# Patient Record
Sex: Male | Born: 1995 | Race: Black or African American | Hispanic: No | Marital: Single | Smoking: Never smoker
Health system: Southern US, Community
[De-identification: ages and names within clinical notes are randomized; demographics above are authoritative.]

## PROBLEM LIST (undated history)

## (undated) DIAGNOSIS — J45909 Unspecified asthma, uncomplicated: Secondary | ICD-10-CM

## (undated) DIAGNOSIS — E119 Type 2 diabetes mellitus without complications: Secondary | ICD-10-CM

## (undated) DIAGNOSIS — T7840XA Allergy, unspecified, initial encounter: Secondary | ICD-10-CM

## (undated) HISTORY — DX: Allergy, unspecified, initial encounter: T78.40XA

---
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
## 2014-01-13 ENCOUNTER — Encounter: Payer: Self-pay | Admitting: Cardiology

## 2015-07-03 ENCOUNTER — Encounter (HOSPITAL_COMMUNITY): Payer: Self-pay | Admitting: *Deleted

## 2015-07-03 ENCOUNTER — Emergency Department (HOSPITAL_COMMUNITY): Payer: No Typology Code available for payment source

## 2015-07-03 ENCOUNTER — Emergency Department (HOSPITAL_COMMUNITY)
Admission: EM | Admit: 2015-07-03 | Discharge: 2015-07-03 | Disposition: A | Discharge status: Home / Self Care | Payer: No Typology Code available for payment source | Attending: Emergency Medicine | Admitting: Emergency Medicine

## 2015-07-03 DIAGNOSIS — Y998 Other external cause status: Secondary | ICD-10-CM | POA: Insufficient documentation

## 2015-07-03 DIAGNOSIS — M25562 Pain in left knee: Secondary | ICD-10-CM

## 2015-07-03 DIAGNOSIS — S8992XA Unspecified injury of left lower leg, initial encounter: Secondary | ICD-10-CM | POA: Insufficient documentation

## 2015-07-03 DIAGNOSIS — Y92310 Basketball court as the place of occurrence of the external cause: Secondary | ICD-10-CM | POA: Insufficient documentation

## 2015-07-03 DIAGNOSIS — Y9367 Activity, basketball: Secondary | ICD-10-CM | POA: Insufficient documentation

## 2015-07-03 DIAGNOSIS — X58XXXA Exposure to other specified factors, initial encounter: Secondary | ICD-10-CM | POA: Insufficient documentation

## 2015-07-03 MED ORDER — IBUPROFEN 400 MG PO TABS
800.0000 mg | ORAL_TABLET | Freq: Once | ORAL | Status: AC
  Administered 2015-07-03: 800 mg via ORAL
  Filled 2015-07-03: qty 2

## 2015-07-03 NOTE — ED Provider Notes (Signed)
CSN: [id number]     Arrival date & time 07/03/15  1240 History  By signing my name below, I, Bethel BornBritney McCollum, attest that this documentation has been prepared under the direction and in the presence of Alveta HeimlichStevi Owain Eckerman, PA-C Electronically Signed: Bethel BornBritney McCollum, ED Scribe. 07/03/2015 2:10 PM   Chief Complaint  Patient presents with  . Knee Pain   Patient is a 20 y.o. male presenting with knee pain. The history is provided by the patient. No language interpreter was used.  Knee Pain Location:  Knee Injury: yes   Knee location:  L knee Pain details:    Quality:  Aching   Radiates to:  Does not radiate   Severity:  Moderate   Onset quality:  Sudden   Timing:  Intermittent   Progression:  Waxing and waning Prior injury to area:  Yes Relieved by:  Rest Worsened by:  Bearing weight and flexion Ineffective treatments:  Ice, elevation and NSAIDs Associated symptoms: no decreased ROM, no muscle weakness, no numbness, no stiffness, no swelling and no tingling    Myrle ShengKaysaun T Turk is a 20 y.o. male who presents to the Emergency Department complaining of constant, 8/10 in severity, aching, anterior left knee pain with onset 6 days ago while playing basketball on a concrete surface. Pt states that he went up for a dunk and came down on the left leg wrong. He denies a twisting injury to the knee; states he came down hard on a straightened leg. He has had a similar injury in the past but this time ibuprofen, ice, and stretching have provided no relief in pain. The pain is worse in the morning upon waking and exacerbated by bending the knee. He states that the knee feels unsteady while walking like it will give out or lock up. The pain is somewhat alleviate with rest. Pt denies swelling at the knee and other injury sustained. Denies weakness or numbness in the left leg. No other complaints today.   History reviewed. No pertinent past medical history. History reviewed. No pertinent past surgical  history. History reviewed. No pertinent family history. Social History  Substance Use Topics  . Smoking status: Never Smoker   . Smokeless tobacco: None  . Alcohol Use: No    Review of Systems  Musculoskeletal: Positive for arthralgias. Negative for stiffness.  All other systems reviewed and are negative.     Allergies  Review of patient's allergies indicates no known allergies.  Home Medications   Prior to Admission medications   Medication Sig Start Date End Date Taking? Authorizing Provider  ibuprofen (ADVIL,MOTRIN) 600 MG tablet Take 1 tablet (600 mg total) by mouth every 6 (six) hours as needed. 11/07/13   Earley FavorGail Schulz, NP   BP 124/81 mmHg  Pulse 68  Temp(Src) 98.6 F (37 C) (Oral)  Resp 18  Ht 6\' 3"  (1.905 m)  Wt 181 lb (82.101 kg)  BMI 22.62 kg/m2  SpO2 100% Physical Exam  Constitutional: He appears well-developed and well-nourished. No distress.  HENT:  Head: Normocephalic and atraumatic.  Right Ear: External ear normal.  Left Ear: External ear normal.  Eyes: Conjunctivae are normal. Right eye exhibits no discharge. Left eye exhibits no discharge. No scleral icterus.  Neck: Normal range of motion.  Cardiovascular: Normal rate and intact distal pulses.   Pedal pulse palpable. Cap refill < 3 seconds  Pulmonary/Chest: Effort normal.  Musculoskeletal: Normal range of motion.       Left knee: He exhibits normal range of motion, no  swelling, no effusion, no deformity, normal alignment, no LCL laxity and no MCL laxity. No tenderness found.  No TTP over anterior left knee where pt indicates pain is. No tenderness at medial and lateral joint lines or in popliteal fossa. No effusion present. FROM intact with pain on flexion. No ligamentous laxity. Pt ambulates favoring the left knee. Moves remaining extremities spontaneously.   Neurological: He is alert. Coordination normal.  5/5 strength of BLE. Sensation to light touch intact throughout.   Skin: Skin is warm and dry.   Psychiatric: He has a normal mood and affect. His behavior is normal.  Nursing note and vitals reviewed.   ED Course  Procedures (including critical care time) DIAGNOSTIC STUDIES: Oxygen Saturation is 100% on RA,  normal by my interpretation.    COORDINATION OF CARE: 2:08 PM Discussed treatment plan which includes left knee XR with pt at bedside and pt agreed to plan.  Labs Review Labs Reviewed - No data to display  Imaging Review Dg Knee Complete 4 Views Left  07/03/2015  CLINICAL DATA:  Initial encounter for Old Injury Lt Knee New Pain From Playing Basketball Yesterday EXAM: LEFT KNEE - COMPLETE 4+ VIEW COMPARISON:  None. FINDINGS: No acute fracture or dislocation.  Joint spaces maintained. IMPRESSION: No acute osseous abnormality. Electronically Signed   By: Jeronimo Greaves M.D.   On: 07/03/2015 14:30   I have personally reviewed and evaluated these images as part of my medical decision-making.   EKG Interpretation None      MDM   Final diagnoses:  Left knee pain    Patient presenting with left knee pain after a hard landing playing basketball. Left lower leg is neurovascularly intact with FROM. No TTP of left knee. No associated effusion. Patient X-Ray negative for obvious fracture or dislocation. Pain managed in ED with ibuprofen. Brace and crutches given and conservative therapy recommended. Discussed RICE therapy and use of OTC pain relievers. Pt advised to follow up with orthopedics if symptoms persist. Return precautions discussed at bedside and given in discharge paperwork. Pt is stable for discharge.  I personally performed the services described in this documentation, which was scribed in my presence. The recorded information has been reviewed and is accurate.    Alveta Heimlich, PA-C 07/03/15 1539  Bethann Berkshire, MD 07/04/15 1414

## 2015-07-03 NOTE — ED Notes (Signed)
Declined W/C at D/C and was escorted to lobby by RN. 

## 2015-07-03 NOTE — ED Notes (Signed)
Pt reports knee pain since last Thursday. Pt reports he has put ice and elevated lt leg.

## 2015-07-03 NOTE — Discharge Instructions (Signed)

## 2016-02-01 ENCOUNTER — Encounter (HOSPITAL_COMMUNITY): Payer: Self-pay

## 2016-02-01 ENCOUNTER — Emergency Department (HOSPITAL_COMMUNITY)
Admission: EM | Admit: 2016-02-01 | Discharge: 2016-02-01 | Disposition: A | Discharge status: Home / Self Care | Payer: Medicaid Other | Attending: Emergency Medicine | Admitting: Emergency Medicine

## 2016-02-01 DIAGNOSIS — R112 Nausea with vomiting, unspecified: Secondary | ICD-10-CM | POA: Insufficient documentation

## 2016-02-01 DIAGNOSIS — R197 Diarrhea, unspecified: Secondary | ICD-10-CM | POA: Diagnosis not present

## 2016-02-01 DIAGNOSIS — J069 Acute upper respiratory infection, unspecified: Secondary | ICD-10-CM | POA: Insufficient documentation

## 2016-02-01 DIAGNOSIS — R109 Unspecified abdominal pain: Secondary | ICD-10-CM | POA: Diagnosis present

## 2016-02-01 MED ORDER — ACETAMINOPHEN 500 MG PO TABS
1000.0000 mg | ORAL_TABLET | Freq: Once | ORAL | Status: AC
  Administered 2016-02-01: 1000 mg via ORAL
  Filled 2016-02-01: qty 2

## 2016-02-01 MED ORDER — ALBUTEROL SULFATE HFA 108 (90 BASE) MCG/ACT IN AERS: 2.0000 | INHALATION_SPRAY | RESPIRATORY_TRACT | 0 refills | Status: DC | PRN

## 2016-02-01 MED ORDER — ONDANSETRON 4 MG PO TBDP: 4.0000 mg | ORAL_TABLET | Freq: Three times a day (TID) | ORAL | 0 refills | Status: DC | PRN

## 2016-02-01 MED ORDER — ONDANSETRON 4 MG PO TBDP
4.0000 mg | ORAL_TABLET | Freq: Once | ORAL | Status: AC
  Administered 2016-02-01: 4 mg via ORAL
  Filled 2016-02-01: qty 1

## 2016-02-01 MED ORDER — DICYCLOMINE HCL 20 MG PO TABS: 20.0000 mg | ORAL_TABLET | Freq: Three times a day (TID) | ORAL | 0 refills | Status: DC

## 2016-02-01 MED ORDER — DICYCLOMINE HCL 10 MG PO CAPS
20.0000 mg | ORAL_CAPSULE | Freq: Once | ORAL | Status: AC
  Administered 2016-02-01: 20 mg via ORAL
  Filled 2016-02-01: qty 2

## 2016-02-01 MED ORDER — LOPERAMIDE HCL 2 MG PO CAPS: 2.0000 mg | ORAL_CAPSULE | Freq: Four times a day (QID) | ORAL | 0 refills | Status: DC | PRN

## 2016-02-01 MED ORDER — LOPERAMIDE HCL 2 MG PO CAPS
4.0000 mg | ORAL_CAPSULE | Freq: Once | ORAL | Status: AC
  Administered 2016-02-01: 4 mg via ORAL
  Filled 2016-02-01: qty 2

## 2016-02-01 NOTE — Discharge Instructions (Signed)
You may alternate between Tylenol 1000 mg every 6 hours as needed for fever and pain and ibuprofen 800 mg every 8 hours as needed for fever and pain. °

## 2016-02-01 NOTE — ED Notes (Signed)
Patient Alert and oriented X4. Stable and ambulatory. Patient verbalized understanding of the discharge instructions.  Patient belongings were taken by the patient.  

## 2016-02-01 NOTE — ED Notes (Signed)
Tolerated fluids without difficulty

## 2016-02-01 NOTE — ED Triage Notes (Signed)
Patient comes by EMS for abdominal pain and generalized abdominal cramping.  Patient was playing basketball and went to use the restroom and threw up twice, now has cramping.  No masses, or distention noted. A&Ox4

## 2016-02-01 NOTE — ED Provider Notes (Addendum)
By signing my name below, I, Avnee Patel, attest that this documentation has been prepared under the direction and in the presence of Layla MawKristen N Stephany Poorman, DO  Electronically Signed: Clovis PuAvnee Patel, ED Scribe. 02/01/16. 11:13 PM.  TIME SEEN: 11:05 PM  CHIEF COMPLAINT:  Chief Complaint  Patient presents with  . Abdominal Pain    HPI:   Todd Holloway is a 20 y.o. male, with a hx of asthma, who presents to the Emergency Department complaining of sudden onset, cramping abdominal pain while playing basketball today. Pt also reports nausea, 2 episodes of vomiting, 4 episodes of diarrhea, a sore throat, dry cough and chills. He states he also experienced an asthma exacerbation while playing basketball today. Pt denies fevers, sick contacts, hx of abdominal surgeries, any other associated symptoms and modifying factors at this time. No drug allergies noted.    ROS: See HPI Constitutional: no fever, +chills Eyes: no drainage  ENT: no runny nose, +sore throat    Cardiovascular:  no chest pain  Resp: no SOB, +cough GI: +abdominal pain, nausea, vomiting and diarrhea GU: no dysuria Integumentary: no rash  Allergy: no hives  Musculoskeletal: no leg swelling  Neurological: no slurred speech ROS otherwise negative  PAST MEDICAL HISTORY/PAST SURGICAL HISTORY:  History reviewed. No pertinent past medical history.  MEDICATIONS:  Prior to Admission medications   Medication Sig Start Date End Date Taking? Authorizing Provider  ibuprofen (ADVIL,MOTRIN) 600 MG tablet Take 1 tablet (600 mg total) by mouth every 6 (six) hours as needed. 11/07/13   Earley FavorGail Schulz, NP    ALLERGIES:  No Known Allergies  SOCIAL HISTORY:  Social History  Substance Use Topics  . Smoking status: Never Smoker  . Smokeless tobacco: Never Used  . Alcohol use No    FAMILY HISTORY: History reviewed. No pertinent family history.  EXAM: BP 144/85 (BP Location: Right Arm)   Pulse 96   Temp 98.4 F (36.9 C) (Oral)   Resp 12    Ht 6\' 2"  (1.88 m)   Wt 195 lb (88.5 kg)   SpO2 100%   BMI 25.04 kg/m  CONSTITUTIONAL: Alert and oriented and responds appropriately to questions. Well-appearing; well-nourished. Afebrile, nontoxic, smiling and lauging with friends in room.  HEAD: Normocephalic EYES: Conjunctivae clear, PERRL, EOMI ENT: normal nose; no rhinorrhea; moist mucous membranes; No pharyngeal erythema or petechiae, no tonsillar hypertrophy or exudate, no uvular deviation, no unilateral swelling, no trismus or drooling, no muffled voice, normal phonation, no stridor, no dental caries present, no drainable dental abscess noted, no Ludwig's angina, tongue sits flat in the bottom of the mouth, no angioedema, no facial erythema or warmth, no facial swelling; no pain with movement of the neck NECK: Supple, no meningismus, no nuchal rigidity, no LAD  CARD: RRR; S1 and S2 appreciated; no murmurs, no clicks, no rubs, no gallops RESP: Normal chest excursion without splinting or tachypnea; breath sounds clear and equal bilaterally; no wheezes, no rhonchi, no rales, no hypoxia or respiratory distress, speaking full sentences ABD/GI: Normal bowel sounds; non-distended; soft, non-tender, no rebound, no guarding, no peritoneal signs, no hepatosplenomegaly  BACK:  The back appears normal and is non-tender to palpation, there is no CVA tenderness EXT: Normal ROM in all joints; non-tender to palpation; no edema; normal capillary refill; no cyanosis, no calf tenderness or swelling    SKIN: Normal color for age and race; warm; no rash NEURO: Moves all extremities equally, sensation to light touch intact diffusely, cranial nerves II through XII intact, normal  speech PSYCH: The patient's mood and manner are appropriate. Grooming and personal hygiene are appropriate.  MEDICAL DECISION MAKING: Patient here with complaints of viral syndrome. He is extremely well-appearing on exam, smiling and laughing with his friends. His lungs are clear to  auscultation with no wheezing. No respiratory distress. No hypoxia. Speaking in full sentences. No signs of pharyngitis, deep space neck infection, peritonsillar abscess on exam. Abdomen soft and nontender. Doubt colitis, diverticulitis, pancreatitis, cholecystitis or appendicitis. Doubt meningitis. Doubt pneumonia. No signs of any life-threatening infection. Suspect viral illness. We'll treat symptomatically with Tylenol, Zofran, Imodium and Bentyl. We'll fluid challenge patient.  I feel he can be discharged home with a work note. We'll discharge with prescriptions for the same and have recommended alternating Tylenol and Motrin for fever and pain. Discussed return precautions.  At this time, I do not feel there is any life-threatening condition present. I have reviewed and discussed all results (EKG, imaging, lab, urine as appropriate) and exam findings with patient/family. I have reviewed nursing notes and appropriate previous records.  I feel the patient is safe to be discharged home without further emergent workup and can continue workup as an outpatient as needed. Discussed usual and customary return precautions. Patient/family verbalize understanding and are comfortable with this plan.  Outpatient follow-up has been provided. All questions have been answered.   I personally performed the services described in this documentation, which was scribed in my presence. The recorded information has been reviewed and is accurate.     Layla MawKristen N Sanya Kobrin, DO 02/01/16 2319    Layla MawKristen N Elby Blackwelder, DO 02/01/16 2329

## 2016-06-07 ENCOUNTER — Emergency Department (HOSPITAL_COMMUNITY): Payer: Medicaid Other

## 2016-06-07 ENCOUNTER — Encounter (HOSPITAL_COMMUNITY): Payer: Self-pay | Admitting: Nurse Practitioner

## 2016-06-07 ENCOUNTER — Emergency Department (HOSPITAL_COMMUNITY)
Admission: EM | Admit: 2016-06-07 | Discharge: 2016-06-07 | Disposition: A | Discharge status: Home / Self Care | Payer: Medicaid Other | Attending: Emergency Medicine | Admitting: Emergency Medicine

## 2016-06-07 DIAGNOSIS — H518 Other specified disorders of binocular movement: Secondary | ICD-10-CM

## 2016-06-07 DIAGNOSIS — Z79899 Other long term (current) drug therapy: Secondary | ICD-10-CM | POA: Insufficient documentation

## 2016-06-07 DIAGNOSIS — R51 Headache: Secondary | ICD-10-CM | POA: Insufficient documentation

## 2016-06-07 DIAGNOSIS — J45909 Unspecified asthma, uncomplicated: Secondary | ICD-10-CM | POA: Insufficient documentation

## 2016-06-07 DIAGNOSIS — R930 Abnormal findings on diagnostic imaging of skull and head, not elsewhere classified: Secondary | ICD-10-CM | POA: Insufficient documentation

## 2016-06-07 DIAGNOSIS — H538 Other visual disturbances: Secondary | ICD-10-CM | POA: Diagnosis not present

## 2016-06-07 DIAGNOSIS — R519 Headache, unspecified: Secondary | ICD-10-CM

## 2016-06-07 HISTORY — DX: Unspecified asthma, uncomplicated: J45.909

## 2016-06-07 LAB — CBC WITH DIFFERENTIAL/PLATELET
BASOS ABS: 0 10*3/uL (ref 0.0–0.1)
BASOS PCT: 0 %
Eosinophils Absolute: 0 10*3/uL (ref 0.0–0.7)
Eosinophils Relative: 0 %
HCT: 43.6 % (ref 39.0–52.0)
Hemoglobin: 15.2 g/dL (ref 13.0–17.0)
Lymphocytes Relative: 14 %
Lymphs Abs: 1.3 10*3/uL (ref 0.7–4.0)
MCH: 31.1 pg (ref 26.0–34.0)
MCHC: 34.9 g/dL (ref 30.0–36.0)
MCV: 89.2 fL (ref 78.0–100.0)
MONO ABS: 0.9 10*3/uL (ref 0.1–1.0)
Monocytes Relative: 10 %
Neutro Abs: 7 10*3/uL (ref 1.7–7.7)
Neutrophils Relative %: 76 %
Platelets: 182 10*3/uL (ref 150–400)
RBC: 4.89 MIL/uL (ref 4.22–5.81)
RDW: 12.2 % (ref 11.5–15.5)
WBC: 9.2 10*3/uL (ref 4.0–10.5)

## 2016-06-07 LAB — RAPID URINE DRUG SCREEN, HOSP PERFORMED
Amphetamines: NOT DETECTED
BENZODIAZEPINES: NOT DETECTED
Barbiturates: NOT DETECTED
COCAINE: NOT DETECTED
Opiates: NOT DETECTED
Tetrahydrocannabinol: NOT DETECTED

## 2016-06-07 LAB — BASIC METABOLIC PANEL
Anion gap: 11 (ref 5–15)
BUN: 5 mg/dL — AB (ref 6–20)
CO2: 28 mmol/L (ref 22–32)
Calcium: 9.7 mg/dL (ref 8.9–10.3)
Chloride: 100 mmol/L — ABNORMAL LOW (ref 101–111)
Creatinine, Ser: 1.15 mg/dL (ref 0.61–1.24)
GFR calc Af Amer: >60 mL/min (ref >60)
GFR calc non Af Amer: >60 mL/min (ref >60)
GLUCOSE: 102 mg/dL — AB (ref 65–99)
POTASSIUM: 3.2 mmol/L — AB (ref 3.5–5.1)
Sodium: 139 mmol/L (ref 135–145)

## 2016-06-07 MED ORDER — IOPAMIDOL (ISOVUE-370) INJECTION 76%
INTRAVENOUS | Status: AC
  Filled 2016-06-07: qty 50

## 2016-06-07 MED ORDER — IBUPROFEN 200 MG PO TABS
600.0000 mg | ORAL_TABLET | Freq: Once | ORAL | Status: AC
  Administered 2016-06-07: 16:00:00 600 mg via ORAL
  Filled 2016-06-07: qty 1

## 2016-06-07 MED ORDER — GADOBENATE DIMEGLUMINE 529 MG/ML IV SOLN
19.0000 mL | Freq: Once | INTRAVENOUS | Status: AC | PRN
  Administered 2016-06-07: 19 mL via INTRAVENOUS

## 2016-06-07 MED ORDER — IBUPROFEN 600 MG PO TABS: 600.0000 mg | ORAL_TABLET | Freq: Four times a day (QID) | ORAL | 0 refills | Status: DC | PRN

## 2016-06-07 NOTE — ED Notes (Signed)
Patient transported to CT 

## 2016-06-07 NOTE — Consult Note (Signed)
NEURO HOSPITALIST CONSULT NOTE   Requestig physician: Dr. 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 Patient states it started yesterday afternoon and continued throughout the night. His generalized heaviness of his limbs continued even while he was sleeping. Patient also complains of back pain. Patient one to class today and noticed he continued to have heaviness in his legs and felt as though his gait was off to the point where he had lean against a wall at one point. He also noted that there was some intermittent blurred vision but he could not be very descriptive with this. Patient came to the emergency room to be evaluated.  Past Medical History:  Diagnosis Date  . Asthma     History reviewed. No pertinent surgical history.  Family History: Mother Hypertension Father upper tension  Social History:  reports that he has never smoked. He has never used smokeless tobacco. He reports that he does not drink alcohol or use drugs.  No Known Allergies  MEDICATIONS:                                                                                                                     Current Facility-Administered Medications  Medication Dose Route Frequency Provider Last Rate Last Dose  . iopamidol (ISOVUE-370) 76 % injection            Current Outpatient Prescriptions  Medication Sig Dispense Refill  . albuterol (PROVENTIL HFA;VENTOLIN HFA) 108 (90 Base) MCG/ACT inhaler Inhale 2 puffs into the lungs every 4 (four) hours as needed for wheezing or shortness of  breath. 1 Inhaler 0  . dicyclomine (BENTYL) 20 MG tablet Take 1 tablet (20 mg total) by mouth 3 (three) times daily before meals. As needed for abdominal cramping (Patient not taking: Reported  on 06/07/2016) 15 tablet 0  . ibuprofen (ADVIL,MOTRIN) 600 MG tablet Take 1 tablet (600 mg total) by mouth every 6 (six) hours as needed. (Patient not taking: Reported on 06/07/2016) 30 tablet 0  . loperamide (IMODIUM) 2 MG capsule Take 1 capsule (2 mg total) by mouth 4 (four) times daily as needed for diarrhea or loose stools. (Patient not taking: Reported on 06/07/2016) 12 capsule 0  . ondansetron (ZOFRAN ODT) 4 MG disintegrating tablet Take 1-2 tablets (4-8 mg total) by mouth every 8 (eight) hours as needed for nausea or vomiting. (Patient not taking: Reported on 06/07/2016) 20 tablet 0      ROS:                                                                                                                                       History obtained from the patient  General ROS: Positive for - chills, fatigue,  Psychological ROS: negative for - behavioral disorder, hallucinations, memory difficulties, mood swings or suicidal ideation Ophthalmic ROS: Positive for - blurry vision,  ENT ROS: negative for - epistaxis, nasal discharge, oral lesions, sore throat, tinnitus or vertigo Allergy and Immunology ROS: negative for - hives or itchy/watery eyes Hematological and Lymphatic ROS: negative for - bleeding problems, bruising or swollen lymph nodes Endocrine ROS: negative for - galactorrhea, hair pattern changes, polydipsia/polyuria or temperature intolerance Respiratory ROS: negative for - cough, hemoptysis, shortness of breath or wheezing Cardiovascular ROS: negative for - chest pain, dyspnea on exertion, edema or irregular heartbeat Gastrointestinal ROS: negative for - abdominal pain, diarrhea, hematemesis, nausea/vomiting or stool incontinence Genito-Urinary ROS: negative for - dysuria, hematuria, incontinence  or urinary frequency/urgency Musculoskeletal ROS: negative for - joint swelling or muscular weakness Neurological ROS: as noted in HPI Dermatological ROS: negative for rash and skin lesion changes   Blood pressure (!) 115/59, pulse 72, temperature 99 F (37.2 C), temperature source Oral, resp. rate 15, SpO2 97 %.   Neurologic Examination:                                                                                                      HEENT-  Normocephalic, no lesions, without obvious abnormality.  Normal external eye and conjunctiva.  Normal TM's bilaterally.  Normal auditory canals and external ears. Normal external nose, mucus membranes and septum.  Normal pharynx. Cardiovascular- S1, S2 normal, pulses palpable throughout   Lungs- chest clear, no wheezing, rales, normal symmetric air entry, Heart exam - S1, S2 normal, no  murmur, no gallop, rate regular Abdomen- soft, non-tender; bowel sounds normal; no masses,  no organomegaly Extremities- no edema Lymph-no adenopathy palpable Musculoskeletal-no joint tenderness, deformity or swelling Skin-warm and dry, no hyperpigmentation, vitiligo, or suspicious lesions  Neurological Examination Mental Status: Alert, oriented, thought content appropriate.  Speech fluent without evidence of aphasia.  Able to follow 3 step commands without difficulty. Cranial Nerves: II: Intact bilaterally Visual fields grossly normal III,IV, VI: ptosis not present, extra-ocular motions intact bilaterally,  pupils equal, round, reactive to light and accommodation -When attempting to do extraocular movements patient would erratically move his eyes however they did move conjugately and there were no disconjugate movements. When patient was not paying attention and telling the history there were no erratic movements and he was looking throughout the room with smooth gaze and pursuit  V,VII: smile symmetric, facial light touch sensation normal bilaterally VIII:  hearing normal bilaterally IX,X: uvula rises symmetrically XI: bilateral shoulder shrug XII: midline tongue extension Motor: Right : Upper extremity   5/5    Left:     Upper extremity   5/5  Lower extremity   5/5     Lower extremity   5/5 --Mild give way weakness throughout--especially with hand grip, and left dorsiflexion of ankle Tone and bulk:normal tone throughout; no atrophy noted Sensory: Pinprick and light touch intact throughout, bilaterally Deep Tendon Reflexes: 2+ and symmetric throughout Plantars: Right: downgoing   Left: downgoing Cerebellar: normal finger-to-nose, and normal heel-to-shin test Gait: Not tested      Lab Results: Basic Metabolic Panel:  Recent Labs Lab 06/07/16 1017  NA 139  K 3.2*  CL 100*  CO2 28  GLUCOSE 102*  BUN 5*  CREATININE 1.15  CALCIUM 9.7    Liver Function Tests: No results for input(s): AST, ALT, ALKPHOS, BILITOT, PROT, ALBUMIN in the last 168 hours. No results for input(s): LIPASE, AMYLASE in the last 168 hours. No results for input(s): AMMONIA in the last 168 hours.  CBC:  Recent Labs Lab 06/07/16 1017  WBC 9.2  NEUTROABS 7.0  HGB 15.2  HCT 43.6  MCV 89.2  PLT 182    Cardiac Enzymes: No results for input(s): CKTOTAL, CKMB, CKMBINDEX, TROPONINI in the last 168 hours.  Lipid Panel: No results for input(s): CHOL, TRIG, HDL, CHOLHDL, VLDL, LDLCALC in the last 168 hours.  CBG: No results for input(s): GLUCAP in the last 168 hours.  Microbiology: Results for orders placed or performed during the hospital encounter of 07/27/13  Urine culture     Status: None   Collection Time: 07/27/13  8:03 PM  Result Value Ref Range Status   Specimen Description URINE, CLEAN CATCH  Final   Special Requests Normal  Final   Culture  Setup Time   Final    07/27/2013 21:11 Performed at [facility]   Colony Count NO GROWTH Performed at [facility]  Final   Culture NO GROWTH Performed at [facility]   Final   Report Status 07/29/2013 FINAL  Final    Coagulation Studies: No results for input(s): LABPROT, INR in the last 72 hours.  Imaging: Ct Head Wo Contrast  Result Date: 06/07/2016 CLINICAL DATA:  Dizziness, headache EXAM: CT HEAD WITHOUT CONTRAST TECHNIQUE: Contiguous axial images were obtained from the base of the skull through the vertex without intravenous contrast. COMPARISON:  None. FINDINGS: Brain: No evidence of acute infarction, hemorrhage, hydrocephalus, extra-axial collection or mass lesion/mass effect. Vascular: No hyperdense vessel or unexpected calcification. Skull: No osseous  abnormality. Sinuses/Orbits: Visualized paranasal sinuses are clear. Visualized mastoid sinuses are clear. Visualized orbits demonstrate no focal abnormality. Other: None IMPRESSION: No acute intracranial pathology. Electronically Signed   By: Elige Ko   On: 06/07/2016 11:54       Assessment and plan per attending neurologist  Felicie Morn PA-C [facility] [phone]  06/07/2016, 1:27 PM   I have seen the patient and agree with the above findings. His extraocular movements are not clearly physiologic, but he does complain of photophobia, nausea vomiting, headache.  Assessment/Plan: 21 year old male with possible comp complicated migraine with some embellishment. I do think that an MRI is warranted given his complaints, however if this is negative though it favor treating as complicated migraine.  1) MRI, MRA head 2) if negative, would treat as complicated migraine   Ritta Slot, MD [facility] [phone]  If 7pm- 7am, please page neurology on call as listed in [facility].

## 2016-06-07 NOTE — ED Provider Notes (Addendum)
[place] DEPT Provider Note   CSN: [id number] Arrival date & time: 06/07/16  3500     History   Chief Complaint Chief Complaint  Patient presents with  . Headache    HPI Todd Holloway is a 21 y.o. male.  HPI Pt comes in with cc of headaches. Pt has no medical hx. He reports that yday he started having chills and L sided elbow pain, followed by blurry vision bilaterally and headaches. The headaches are in the front, constant, sharp and moderately severe. Pt also reports sweats and weakness in his legs and nausea. Pt denies any new numbness, tingling, one sided weakness. Pt denies trauma, drug use, alcohol abuse. He denies recent travels, rashes, tick bites. Pt also denies any neck pain or stiffness. PT felt hot, but there is no documented fevers.   Past Medical History:  Diagnosis Date  . Asthma     There are no active problems to display for this patient.   History reviewed. No pertinent surgical history.     Home Medications    Prior to Admission medications   Medication Sig Start Date End Date Taking? Authorizing Provider  albuterol (PROVENTIL HFA;VENTOLIN HFA) 108 (90 Base) MCG/ACT inhaler Inhale 2 puffs into the lungs every 4 (four) hours as needed for wheezing or shortness of breath. 02/01/16   Kristen N Ward, DO  dicyclomine (BENTYL) 20 MG tablet Take 1 tablet (20 mg total) by mouth 3 (three) times daily before meals. As needed for abdominal cramping Patient not taking: Reported on 06/07/2016 02/01/16   Kristen N Ward, DO  ibuprofen (ADVIL,MOTRIN) 600 MG tablet Take 1 tablet (600 mg total) by mouth every 6 (six) hours as needed. Patient not taking: Reported on 06/07/2016 11/07/13   Junius Creamer, NP  loperamide (IMODIUM) 2 MG capsule Take 1 capsule (2 mg total) by mouth 4 (four) times daily as needed for diarrhea or loose stools. Patient not taking: Reported on 06/07/2016 02/01/16   Kristen N Ward, DO  ondansetron (ZOFRAN ODT) 4 MG disintegrating tablet Take  1-2 tablets (4-8 mg total) by mouth every 8 (eight) hours as needed for nausea or vomiting. Patient not taking: Reported on 06/07/2016 02/01/16   [place], DO    Family History History reviewed. No pertinent family history.  Social History Social History  Substance Use Topics  . Smoking status: Never Smoker  . Smokeless tobacco: Never Used  . Alcohol use No     Allergies   Patient has no known allergies.   Review of Systems Review of Systems  All other systems reviewed and are negative.    Physical Exam Updated Vital Signs BP (!) 115/59   Pulse 72   Temp 99 F (37.2 C) (Oral)   Resp 15   SpO2 97%   Physical Exam  Constitutional: He is oriented to person, place, and time. He appears well-developed and well-nourished.  HENT:  Head: Normocephalic and atraumatic.  Eyes: Pupils are equal, round, and reactive to light.  Pt is noted to have saccadic eye movement with his gaze, in both direction for both of the eyes.     Visual Acuity  Right Eye Distance:   Left Eye Distance:   Bilateral Distance:    Right Eye Near: R Near: 20/80 Left Eye Near:  L Near: 20/63 Bilateral Near:  20/125   Neck: Normal range of motion. Neck supple. No JVD present.  Cardiovascular: Normal rate and regular rhythm.   Pulmonary/Chest: Effort normal and breath sounds normal.  No respiratory distress. He has no wheezes.  Abdominal: Soft. Bowel sounds are normal. He exhibits no distension. There is no tenderness. There is no rebound and no guarding.  Neurological: He is alert and oriented to person, place, and time. No cranial nerve deficit. Coordination normal.  NIHSS - 0 No objective sensory deficits, Motor strength upper and lower extremity 4+ and equal Normal cerebellar exam Pt ambulated and the gait was steady  Skin: Skin is warm and dry.  Nursing note and vitals reviewed.    ED Treatments / Results  Labs (all labs ordered are listed, but only abnormal results are  displayed) Labs Reviewed  BASIC METABOLIC PANEL - Abnormal; Notable for the following:       Result Value   Potassium 3.2 (*)    Chloride 100 (*)    Glucose, Bld 102 (*)    BUN 5 (*)    All other components within normal limits  CBC WITH DIFFERENTIAL/PLATELET  RAPID URINE DRUG SCREEN, HOSP PERFORMED    EKG  EKG Interpretation  Date/Time:  Friday June 07 2016 09:38:30 EDT Ventricular Rate:  79 PR Interval:    QRS Duration: 83 QT Interval:  367 QTC Calculation: 421 R Axis:   36 Text Interpretation:  Sinus rhythm No acute changes No old tracing to compare Confirmed by Kathrynn Humble, MD, Thelma Comp [phone]) on 06/07/2016 11:02:13 AM       Radiology Ct Head Wo Contrast  Result Date: 06/07/2016 CLINICAL DATA:  Dizziness, headache EXAM: CT HEAD WITHOUT CONTRAST TECHNIQUE: Contiguous axial images were obtained from the base of the skull through the vertex without intravenous contrast. COMPARISON:  None. FINDINGS: Brain: No evidence of acute infarction, hemorrhage, hydrocephalus, extra-axial collection or mass lesion/mass effect. Vascular: No hyperdense vessel or unexpected calcification. Skull: No osseous abnormality. Sinuses/Orbits: Visualized paranasal sinuses are clear. Visualized mastoid sinuses are clear. Visualized orbits demonstrate no focal abnormality. Other: None IMPRESSION: No acute intracranial pathology. Electronically Signed   By: Kathreen Devoid   On: 06/07/2016 11:54    Procedures Procedures (including critical care time)  Medications Ordered in ED Medications  iopamidol (ISOVUE-370) 76 % injection (not administered)     Initial Impression / Assessment and Plan / ED Course  I have reviewed the triage vital signs and the nursing notes.  Pertinent labs & imaging results that were available during my care of the patient were reviewed by me and considered in my medical decision making (see chart for details).  Clinical Course as of Jun 08 1326  Fri Jun 07, 2016  1319 Labs  reassuring. Neuro recommended CT angio head and neck and they will see the patient.  [AN]    Clinical Course User Index [AN] Varney Biles, MD    Pt comes in with cc of headache and myriad of non specific complains.  He has bilateral blurry vision and also noted to have saccadic eye movement bilaterally for both of the eyes.  UDS is neg. Hx not indicative of any red flags for infection, drug use, trauma. CT head is neg. There hs no meningismus.  Neuro consulted. Concerns would be for viral encephalopathy or midbrain disease leading to the gaze issues. demyelating disease also possible.  1:29 PM Neuro also recommends MRI brain. Their suspicion is low for organic dz.   Final Clinical Impressions(s) / ED Diagnoses   Final diagnoses:  Acute nonintractable headache, unspecified headache type  Blurry vision, bilateral    New Prescriptions New Prescriptions   No medications on file  Varney Biles, MD 06/07/16 [place], MD 06/07/16 1330

## 2016-06-07 NOTE — ED Triage Notes (Signed)
Pt presents with c/o headache. The headache began last night. He reports sweats, blurred vision, weakness and pain in his legs, nausea. He tried nyquil with no relief. He denies a past history of headaches.

## 2016-06-07 NOTE — Discharge Instructions (Signed)
We saw you in the ER for headaches and other associated symptoms. We are not sure what is causing your headaches, however, there appears to be no evidence of infection, bleeds or tumors based on our exam and results. Neurology has cleared you. MRI of the brain are normal.  Please take motrin round the clock for the next 6 hours, and take other meds prescribed only for break through pain. See your doctor if the pain persists, as you might need better medications or a specialist.  Please return to the ER if the headache gets severe and in not improving, you have associated new one sided numbness, tingling, weakness or confusion, seizures, poor balance or poor vision.  See the eye specialist if the vision continues to be blurry.

## 2016-06-11 ENCOUNTER — Ambulatory Visit (HOSPITAL_COMMUNITY)
Admission: EM | Admit: 2016-06-11 | Discharge: 2016-06-11 | Disposition: A | Discharge status: Home / Self Care | Payer: Medicaid Other | Attending: Family Medicine | Admitting: Family Medicine

## 2016-06-11 ENCOUNTER — Encounter (HOSPITAL_COMMUNITY): Payer: Self-pay

## 2016-06-11 ENCOUNTER — Encounter (HOSPITAL_COMMUNITY): Payer: Self-pay | Admitting: Emergency Medicine

## 2016-06-11 ENCOUNTER — Emergency Department (HOSPITAL_COMMUNITY)
Admission: EM | Admit: 2016-06-11 | Discharge: 2016-06-12 | Disposition: A | Discharge status: Home / Self Care | Payer: Medicaid Other | Attending: Emergency Medicine | Admitting: Emergency Medicine

## 2016-06-11 DIAGNOSIS — J45909 Unspecified asthma, uncomplicated: Secondary | ICD-10-CM | POA: Insufficient documentation

## 2016-06-11 DIAGNOSIS — R300 Dysuria: Secondary | ICD-10-CM

## 2016-06-11 DIAGNOSIS — Z79899 Other long term (current) drug therapy: Secondary | ICD-10-CM | POA: Diagnosis not present

## 2016-06-11 DIAGNOSIS — G4489 Other headache syndrome: Secondary | ICD-10-CM | POA: Diagnosis not present

## 2016-06-11 DIAGNOSIS — R112 Nausea with vomiting, unspecified: Secondary | ICD-10-CM | POA: Diagnosis present

## 2016-06-11 DIAGNOSIS — A609 Anogenital herpesviral infection, unspecified: Secondary | ICD-10-CM | POA: Diagnosis not present

## 2016-06-11 DIAGNOSIS — H519 Unspecified disorder of binocular movement: Secondary | ICD-10-CM

## 2016-06-11 DIAGNOSIS — Z202 Contact with and (suspected) exposure to infections with a predominantly sexual mode of transmission: Secondary | ICD-10-CM | POA: Diagnosis not present

## 2016-06-11 LAB — PROTEIN, CSF: TOTAL PROTEIN, CSF: 24 mg/dL (ref 15–45)

## 2016-06-11 LAB — CSF CELL COUNT WITH DIFFERENTIAL
RBC COUNT CSF: 1 /mm3 — AB
RBC Count, CSF: 6 /mm3 — ABNORMAL HIGH
Tube #: 1
Tube #: 4
WBC CSF: 1 /mm3 (ref 0–5)
WBC, CSF: 2 /mm3 (ref 0–5)

## 2016-06-11 LAB — URINALYSIS, ROUTINE W REFLEX MICROSCOPIC
Glucose, UA: NEGATIVE mg/dL
HGB URINE DIPSTICK: NEGATIVE
KETONES UR: 80 mg/dL — AB
LEUKOCYTES UA: NEGATIVE
Nitrite: NEGATIVE
PH: 8 (ref 5.0–8.0)
PROTEIN: 30 mg/dL — AB
Specific Gravity, Urine: 1.027 (ref 1.005–1.030)
Squamous Epithelial / LPF: NONE SEEN

## 2016-06-11 LAB — COMPREHENSIVE METABOLIC PANEL
ALT: 27 U/L (ref 17–63)
AST: 50 U/L — AB (ref 15–41)
Albumin: 4 g/dL (ref 3.5–5.0)
Alkaline Phosphatase: 65 U/L (ref 38–126)
Anion gap: 8 (ref 5–15)
BUN: 11 mg/dL (ref 6–20)
CALCIUM: 9.1 mg/dL (ref 8.9–10.3)
CO2: 28 mmol/L (ref 22–32)
Chloride: 100 mmol/L — ABNORMAL LOW (ref 101–111)
Creatinine, Ser: 1.12 mg/dL (ref 0.61–1.24)
GFR calc Af Amer: >60 mL/min (ref >60)
GFR calc non Af Amer: >60 mL/min (ref >60)
Glucose, Bld: 112 mg/dL — ABNORMAL HIGH (ref 65–99)
POTASSIUM: 3.3 mmol/L — AB (ref 3.5–5.1)
SODIUM: 136 mmol/L (ref 135–145)
TOTAL PROTEIN: 7.4 g/dL (ref 6.5–8.1)
Total Bilirubin: 0.7 mg/dL (ref 0.3–1.2)

## 2016-06-11 LAB — RAPID HIV SCREEN (HIV 1/2 AB+AG)
HIV 1/2 ANTIBODIES: NONREACTIVE
HIV-1 P24 Antigen - HIV24: NONREACTIVE

## 2016-06-11 LAB — GLUCOSE, CSF: GLUCOSE CSF: 64 mg/dL (ref 40–70)

## 2016-06-11 LAB — CBC
HEMATOCRIT: 41 % (ref 39.0–52.0)
Hemoglobin: 14.5 g/dL (ref 13.0–17.0)
MCH: 30.8 pg (ref 26.0–34.0)
MCHC: 35.4 g/dL (ref 30.0–36.0)
MCV: 87 fL (ref 78.0–100.0)
Platelets: 189 10*3/uL (ref 150–400)
RBC: 4.71 MIL/uL (ref 4.22–5.81)
RDW: 11.5 % (ref 11.5–15.5)
WBC: 8 10*3/uL (ref 4.0–10.5)

## 2016-06-11 LAB — LIPASE, BLOOD: LIPASE: 28 U/L (ref 11–51)

## 2016-06-11 LAB — RAPID STREP SCREEN (MED CTR MEBANE ONLY): Streptococcus, Group A Screen (Direct): NEGATIVE

## 2016-06-11 MED ORDER — AZITHROMYCIN 250 MG PO TABS
ORAL_TABLET | ORAL | Status: AC
  Filled 2016-06-11: qty 4

## 2016-06-11 MED ORDER — ACYCLOVIR 800 MG PO TABS: 800.0000 mg | ORAL_TABLET | Freq: Every day | ORAL | 0 refills | Status: DC

## 2016-06-11 MED ORDER — KETOROLAC TROMETHAMINE 30 MG/ML IJ SOLN
30.0000 mg | Freq: Once | INTRAMUSCULAR | Status: AC
  Administered 2016-06-11: 30 mg via INTRAVENOUS
  Filled 2016-06-11: qty 1

## 2016-06-11 MED ORDER — DEXTROSE 5 % IV SOLN
10.0000 mg/kg | Freq: Once | INTRAVENOUS | Status: AC
  Administered 2016-06-11: 850 mg via INTRAVENOUS
  Filled 2016-06-11: qty 17

## 2016-06-11 MED ORDER — SODIUM CHLORIDE 0.9 % IV BOLUS (SEPSIS)
1000.0000 mL | Freq: Once | INTRAVENOUS | Status: AC
  Administered 2016-06-11: 1000 mL via INTRAVENOUS

## 2016-06-11 MED ORDER — LIDOCAINE HCL (PF) 1 % IJ SOLN
5.0000 mL | Freq: Once | INTRAMUSCULAR | Status: AC
  Administered 2016-06-11: 5 mL
  Filled 2016-06-11: qty 5

## 2016-06-11 MED ORDER — CEFTRIAXONE SODIUM 250 MG IJ SOLR
250.0000 mg | Freq: Once | INTRAMUSCULAR | Status: AC
  Administered 2016-06-11: 250 mg via INTRAMUSCULAR

## 2016-06-11 MED ORDER — ONDANSETRON HCL 4 MG/2ML IJ SOLN
4.0000 mg | Freq: Once | INTRAMUSCULAR | Status: AC
  Administered 2016-06-11: 4 mg via INTRAVENOUS
  Filled 2016-06-11: qty 2

## 2016-06-11 MED ORDER — ONDANSETRON 4 MG PO TBDP
4.0000 mg | ORAL_TABLET | Freq: Three times a day (TID) | ORAL | 0 refills | Status: DC | PRN
Start: 2016-06-11 — End: 2017-09-01

## 2016-06-11 MED ORDER — AZITHROMYCIN 250 MG PO TABS
1000.0000 mg | ORAL_TABLET | Freq: Once | ORAL | Status: AC
  Administered 2016-06-11: 1000 mg via ORAL

## 2016-06-11 MED ORDER — CEFTRIAXONE SODIUM 250 MG IJ SOLR
INTRAMUSCULAR | Status: AC
  Filled 2016-06-11: qty 250

## 2016-06-11 MED ORDER — LIDOCAINE HCL (PF) 1 % IJ SOLN
INTRAMUSCULAR | Status: AC
  Filled 2016-06-11: qty 2

## 2016-06-11 NOTE — Discharge Instructions (Signed)
You're being treated today with Rocephin, and azithromycin, to cover for your exposure to chlamydia. The urine samples you gave today will be tested for gonorrhea, chlamydia, Trichomonas, and blood work is been obtained to test for HIV, and syphilis. Your skin rash, is consistent with infection with HSV, and I have started you on acyclovir, take one tablet 5 times a day for 7 days. I recommend safe sex practices or abstinence for the next 7 days, I recommend you follow-up with the health department or return to clinic for rescreening to ensure clearance. If you are positive for anything on these tests, you will be notified in 3-5 business days.

## 2016-06-11 NOTE — ED Provider Notes (Signed)
Emergency Department Provider Note   I have reviewed the triage vital signs and the nursing notes.   HISTORY  Chief Complaint Emesis and Abdominal Pain   HPI Todd Holloway is a 21 y.o. male with PMH of asthma presents to the ED for evaluation of nausea, vomiting, and abdominal pain with continued intermittent HA. The patient was seen in the emergency department several days ago with headache. He was found to have some gaze abnormality and ultimately had an MRI of his brain and neurology consultation. This workup was unremarkable and his symptoms are thought to be from a complicated migraine. Patient states he was given medications and has taken these over the past several days with intermittent relief. Patient woke up this morning without a headache and was feeling well. He states he had a new lesion on the penis and went to urgent care where he was reportedly diagnosed with HSV. Patient states that on the ride home from that visit he had acute onset nausea and vomiting with generalized abdominal discomfort. He notes return of his headache upon return home. He reports fever over the past several days with the highest recorded value of 99 F. he is also complaining of some mild sore throat. Patient was given acyclovir at urgent care for likely herpes. Patient denies any auditory or visual hallucinations. He does not take other medications. Reports generalized weakness but no focal weakness/numbness.   Past Medical History:  Diagnosis Date  . Asthma     Patient Active Problem List   Diagnosis Date Noted  . HSV (herpes simplex virus) anogenital infection 06/11/2016    History reviewed. No pertinent surgical history.  Current Outpatient Rx  . Order #: [id number] Class: Print  . Order #: [id number] Class: Normal  . Order #: [id number] Class: Print    Allergies Patient has no known allergies.  No family history on file.  Social History Social History  Substance Use Topics  .  Smoking status: Never Smoker  . Smokeless tobacco: Never Used  . Alcohol use No    Review of Systems  Constitutional: No fever/chills Eyes: No visual changes. ENT: Positive sore throat. Cardiovascular: Denies chest pain. Respiratory: Denies shortness of breath. Gastrointestinal: Positive generalized abdominal pain. Positive nausea and vomiting.  No diarrhea.  No constipation. Genitourinary: Negative for dysuria. Musculoskeletal: Negative for back pain. Skin: Negative for rash. Neurological: Negative for focal weakness or numbness. Positive HA.   10-point ROS otherwise negative.  ____________________________________________   PHYSICAL EXAM:  VITAL SIGNS: ED Triage Vitals  Enc Vitals Group     BP 06/11/16 1745 115/80     Pulse Rate 06/11/16 1745 87     Resp 06/11/16 1745 18     Temp 06/11/16 1745 99 F (37.2 C)     Temp Source 06/11/16 1745 Oral     SpO2 06/11/16 1745 100 %     Weight 06/11/16 1741 187 lb (84.8 kg)     Height 06/11/16 1741  (1.88 m)     Pain Score 06/11/16 1741 8    Constitutional: Alert and oriented. Well appearing and in no acute distress. Eyes: Conjunctivae are normal. PERRL. EOM with some irradic movements during exam but smooth movement during history and  Head: Atraumatic. Nose: No congestion/rhinnorhea. Mouth/Throat: Mucous membranes are moist.  Oropharynx with mild erythema.  Neck: No stridor.  No meningeal signs. No cervical spine tenderness to palpation. Cardiovascular: Normal rate, regular rhythm. Good peripheral circulation. Grossly normal heart sounds.   Respiratory: Normal respiratory effort.  No retractions. Lungs CTAB. Gastrointestinal: Soft and nontender. No distention.  Musculoskeletal: No lower extremity tenderness nor edema. No gross deformities of extremities. Neurologic:  Normal speech and language. No gross focal neurologic deficits are appreciated. Normal CN exam.  Skin:  Skin is warm, dry and intact. No rash  noted. Psychiatric: Mood and affect are somewhat bizarre at times and his story is disorganized. Not responding to internal stimuli.   ____________________________________________   LABS (all labs ordered are listed, but only abnormal results are displayed)  Labs Reviewed  COMPREHENSIVE METABOLIC PANEL - Abnormal; Notable for the following:       Result Value   Potassium 3.3 (*)    Chloride 100 (*)    Glucose, Bld 112 (*)    AST 50 (*)    All other components within normal limits  URINALYSIS, ROUTINE W REFLEX MICROSCOPIC - Abnormal; Notable for the following:    Color, Urine AMBER (*)    APPearance CLOUDY (*)    Bilirubin Urine SMALL (*)    Ketones, ur 80 (*)    Protein, ur 30 (*)    Bacteria, UA RARE (*)    All other components within normal limits  CSF CELL COUNT WITH DIFFERENTIAL - Abnormal; Notable for the following:    RBC Count, CSF 6 (*)    All other components within normal limits  CSF CELL COUNT WITH DIFFERENTIAL - Abnormal; Notable for the following:    RBC Count, CSF 1 (*)    All other components within normal limits  RAPID STREP SCREEN (NOT AT [facility])  CULTURE, GROUP A STREP ([facility])  CSF CULTURE  HSV CULTURE AND TYPING  LIPASE, BLOOD  CBC  GLUCOSE, CSF  PROTEIN, CSF  RAPID HIV SCREEN (HIV 1/2 AB+AG)  HERPES SIMPLEX VIRUS(HSV) DNA BY PCR  VARICELLA-ZOSTER BY PCR  VDRL, CSF  IGG CSF INDEX  B. BURGDORFI ANTIBODIES, CSF  B. BURGDORFI ANTIBODIES  ACETYLCHOLINE RECEPTOR, BINDING   ____________________________________________   PROCEDURES  Procedure(s) performed:   .Lumbar Puncture Date/Time: 06/11/2016 8:41 PM Performed by: LONG, JOSHUA G Authorized by: Maia Plan   Consent:    Consent obtained:  Written   Consent given by:  Patient   Risks discussed:  Bleeding, infection, pain, repeat procedure, nerve damage and headache   Alternatives discussed:  Alternative treatment Pre-procedure details:    Procedure purpose:  Diagnostic   Preparation:  Patient was prepped and draped in usual sterile fashion   Anesthesia (see MAR for exact dosages):    Anesthesia method:  Local infiltration   Local anesthetic:  Lidocaine 1% w/o epi Procedure details:    Lumbar space:  L3-L4 interspace   Patient position:  L lateral decubitus   Needle gauge:  20   Needle type:  Spinal needle - Quincke tip   Needle length (in):  3.5   Ultrasound guidance: no     Number of attempts:  2   Opening pressure (cm H2O):  21   Fluid appearance:  Clear   Tubes of fluid:  4   Total volume (ml):  8 Post-procedure:    Puncture site:  Adhesive bandage applied and direct pressure applied   Patient tolerance of procedure:  Tolerated well, no immediate complications   ____________________________________________   INITIAL IMPRESSION / ASSESSMENT AND PLAN / ED COURSE  Pertinent labs & imaging results that were available during my care of the patient were reviewed by me and considered in my medical decision making (see chart for details).  Patient presents  to the emergency department for evaluation of nausea, vomiting, abdominal pain with intermittent headaches over the past several days. Patient saw neurology during his last ED presentation along with an MRI that were both normal. Patient has no signs or symptoms to suggest an underlying infectious etiology of his symptoms only was diagnosed with HSV today at urgent care. Afebrile here. No focal neurological deficits other than abnormal EOM which was commented on previously. Plan for repeat labs, IVF, nausea control and reassessment.   Spoke with Dr. Otelia Limes who consulted on the patient in the ED. Reviewed available CSF testing which is unremarkable. No indication for further testing and neuro imaging. Patient is feeling better after Zofran. Will continue outpatient Acyclovir for HSV genital outbreak. Discussed f/u plan and provided multiple PCP f/u options.   At this time, I do not feel there is any life-threatening  condition present. I have reviewed and discussed all results (EKG, imaging, lab, urine as appropriate), exam findings with patient. I have reviewed nursing notes and appropriate previous records.  I feel the patient is safe to be discharged home without further emergent workup. Discussed usual and customary return precautions. Patient and family (if present) verbalize understanding and are comfortable with this plan.  Patient will follow-up with their primary care provider. If they do not have a primary care provider, information for follow-up has been provided to them. All questions have been answered.  ____________________________________________  FINAL CLINICAL IMPRESSION(S) / ED DIAGNOSES  Final diagnoses:  Non-intractable vomiting with nausea, unspecified vomiting type  Other headache syndrome     MEDICATIONS GIVEN DURING THIS VISIT:  Medications  sodium chloride 0.9 % bolus 1,000 mL (0 mLs Intravenous Stopped 06/11/16 2031)  ondansetron (ZOFRAN) injection 4 mg (4 mg Intravenous Given 06/11/16 1816)  ketorolac (TORADOL) 30 MG/ML injection 30 mg (30 mg Intravenous Given 06/11/16 1816)  lidocaine (PF) (XYLOCAINE) 1 % injection 5 mL (5 mLs Infiltration Given 06/11/16 1930)  acyclovir (ZOVIRAX) 850 mg in dextrose 5 % 150 mL IVPB (0 mg Intravenous Stopped 06/11/16 2230)     NEW OUTPATIENT MEDICATIONS STARTED DURING THIS VISIT:  Discharge Medication List as of 06/11/2016 11:52 PM    START taking these medications   Details  ondansetron (ZOFRAN ODT) 4 MG disintegrating tablet Take 1 tablet (4 mg total) by mouth every 8 (eight) hours as needed for nausea or vomiting., Starting Tue 06/11/2016, Print          Note:  This document was prepared using Dragon voice recognition software and may include unintentional dictation errors.  Alona Bene, MD Emergency Medicine   Maia Plan, MD 06/12/16 1045

## 2016-06-11 NOTE — ED Notes (Signed)
Instructed on obtaining "dirty " and clean urine.

## 2016-06-11 NOTE — ED Notes (Signed)
Kirkpatrick, MD at bedside.  

## 2016-06-11 NOTE — ED Provider Notes (Signed)
CSN: [id number]     Arrival date & time 06/11/16  1408 History   First MD Initiated Contact with Patient 06/11/16 1429     Chief Complaint  Patient presents with  . Exposure to STD   (Consider location/radiation/quality/duration/timing/severity/associated sxs/prior Treatment) 21 year old male presents to clinic for evaluation of possible STD. He states he got a phone call Friday from a previous partner informing him that she tested positive for chlamydia. He is complaining of dysuria, stating that it "burns" when he urinates, and feels like "needles" he is also complaining of 3-4 small skin lesions on the shaft of his penis, but it been there since Friday as well. States these lesions are quite painful. He denies any fever, chills, nausea, vomiting, back pain, abdominal pain, or swollen lymph nodes.   The history is provided by the patient.  Dysuria  This is a new problem. The current episode started more than 2 days ago. The problem occurs constantly. The problem has not changed since onset.Pertinent negatives include no chest pain, no abdominal pain, no headaches and no shortness of breath. Exacerbated by: urination. Nothing relieves the symptoms. He has tried nothing for the symptoms. The treatment provided no relief.    Past Medical History:  Diagnosis Date  . Asthma    History reviewed. No pertinent surgical history. No family history on file. Social History  Substance Use Topics  . Smoking status: Never Smoker  . Smokeless tobacco: Never Used  . Alcohol use No    Review of Systems  Constitutional: Negative.   HENT: Negative.   Respiratory: Negative for shortness of breath and wheezing.   Cardiovascular: Negative for chest pain.  Gastrointestinal: Negative for abdominal pain.  Genitourinary: Positive for dysuria, genital sores and penile pain. Negative for discharge, penile swelling, scrotal swelling and testicular pain.  Musculoskeletal: Negative.   Skin: Positive for rash.  Negative for color change.  Neurological: Negative for dizziness and headaches.  Hematological: Negative.     Allergies  Patient has no known allergies.  Home Medications   Prior to Admission medications   Medication Sig Start Date End Date Taking? Authorizing Provider  acyclovir (ZOVIRAX) 800 MG tablet Take 1 tablet (800 mg total) by mouth 5 (five) times daily. 06/11/16   Dorena Bodo, NP  albuterol (PROVENTIL HFA;VENTOLIN HFA) 108 (90 Base) MCG/ACT inhaler Inhale 2 puffs into the lungs every 4 (four) hours as needed for wheezing or shortness of breath. 02/01/16   Layla Maw Ward, DO   Meds Ordered and Administered this Visit   Medications  cefTRIAXone (ROCEPHIN) injection 250 mg (250 mg Intramuscular Given 06/11/16 1450)  azithromycin (ZITHROMAX) tablet 1,000 mg (1,000 mg Oral Given 06/11/16 1449)    BP 131/83 (BP Location: Right Arm)   Pulse 83   Temp 98.3 F (36.8 C) (Oral)   Resp 20   SpO2 98%  No data found.   Physical Exam  Constitutional: He is oriented to person, place, and time. He appears well-developed and well-nourished. No distress.  HENT:  Head: Normocephalic and atraumatic.  Right Ear: External ear normal.  Left Ear: External ear normal.  Abdominal: Soft. Bowel sounds are normal. He exhibits no distension. There is no tenderness. There is no guarding.  Genitourinary: Penis normal. Circumcised. No discharge found.     Neurological: He is alert and oriented to person, place, and time.  Skin: Skin is warm and dry. Capillary refill takes less than 2 seconds. He is not diaphoretic.  Psychiatric: He has a normal mood  and affect. His behavior is normal.  Nursing note and vitals reviewed.   Urgent Care Course     Procedures (including critical care time)  Labs Review Labs Reviewed  RPR  HIV ANTIBODY (ROUTINE TESTING)  URINE CYTOLOGY ANCILLARY ONLY    Imaging Review No results found.     MDM   1. Exposure to STD   2. Dysuria   3. HSV (herpes  simplex virus) anogenital infection    The patient was treated in clinic with ceftriaxone, and azithromycin. Counseling provided on safe sex practices, prescription sent to the pharmacy for acyclovir, blood and urine samples obtained and tested for gonorrhea, chlamydia, Trichomonas, HIV, and syphilis. Follow-up with Public health, or return to clinic as necessary.     Dorena Bodo, NP 06/11/16 [phone]

## 2016-06-11 NOTE — ED Triage Notes (Signed)
Patient reports receiving a call from a partner and was told he was exposed to chlamydia. Patient hurts with urination

## 2016-06-11 NOTE — ED Notes (Signed)
Long, MD at bedside.  

## 2016-06-11 NOTE — Discharge Instructions (Signed)
You were seen in the ED today with continued headache, fever, and abdominal pain/vomiting. We performed a lumbar puncture which was normal. You will need to follow up with a PCP in the coming week. I have included a list of PCPs in the area who can assist you.   Your work up today has not shown a clear cause for your symptoms. You have been prescribed Zofran; please use as prescribed as needed for your nausea.  Follow up with your doctor as soon as possible regarding today?s emergent visit and your symptoms of nausea.   Return to the ED if you develop abdominal, bloody vomiting, bloody diarrhea, if you are unable to tolerate fluids due to vomiting, or if you develop other symptoms that concern you.

## 2016-06-11 NOTE — ED Triage Notes (Signed)
Pt from home with ems c/o abd pain N/V since last Friday. Pt was seen at urgent care today for sore throat and migrane. VSS 126/84 HR 90. Pt a/o nad.

## 2016-06-11 NOTE — Consult Note (Signed)
NEURO HOSPITALIST CONSULT NOTE   Requestig physician: Dr. Jacqulyn Bath  Reason for Consult: Abnormal eye movements  History obtained from:  Patient and Chart     HPI:                                                                                                                                          Todd Holloway is an 21 y.o. male with recent admission for possible complicated migraine who re-presented today with multiple complaints. He initially presented today to Urgent Care for evaluation of possible STD, per noted: "He states he got a phone call Friday from a previous partner informing him that she tested positive for chlamydia. He is complaining of dysuria, stating that it "burns" when he urinates, and feels like "needles" he is also complaining of 3-4 small skin lesions on the shaft of his penis, but it been there since Friday as well. States these lesions are quite painful. He denies any fever, chills, nausea, vomiting, back pain, abdominal pain, or swollen lymph nodes."  At the Urgent Care center, he was diagnosed with HSV anogenital infection and treated with ceftriaxone and azithromycin. He was then discharged home. Following discharge, the patient began experiencing severe abdominal pain. He states that the abdominal pain was 9/10 in intensity and that he was writhing on the floor from this. EMS was called and he was emergently transported to the ED. Per EMS, he stated to them that the abdominal pain had started last Friday and that he had also had N/V with this.   In the ED, work up was initiated. Although the patient did not complain of ocular symptoms, ED physician noted abnormal eye movements on initial exam. Per report, the abnormal eye movements occurred while EOM were being specifically examined, but were normal at other times during the interview with the ED physician. Neurology was called to further evaluate.   Past Medical History:  Diagnosis Date  . Asthma      History reviewed. No pertinent surgical history.  No family history on file.  Social History:  reports that he has never smoked. He has never used smokeless tobacco. He reports that he does not drink alcohol or use drugs.  No Known Allergies  MEDICATIONS:  Prior to Admission:  (Not in a hospital admission)   ROS:                                                                                                                                       As per HPI.   Blood pressure 116/67, pulse 85, temperature 99 F (37.2 C), temperature source Oral, resp. rate 18, height  (1.88 m), weight 84.8 kg (187 lb), SpO2 97 %.  General Examination:                                                                                                      HEENT-  [place]/AT  Lungs- Respirations unlabored Extremities- No edema  Neurological Examination Mental Status: Alert and oriented. Withdrawn with anxious, dysthymic affect. Eye contact is fair. When specifically tested, concentration, calculations and ability to follow 2-step directional commands are markedly impaired, which are incongruous with apparent normal thought processes evident on interview regarding recent symptoms, PMHx and social history. Speech fluent during interview, becoming halting when specifically tested in addition to impairments of formally-tested naming and repetition, which are not congruous with speech when he relates his history of symptoms.  Cranial Nerves: II: Does not cooperate with testing of visual fields despite numerous attempts, engaging in bizarre roving EOM alternating with episodes of fixed staring, eye blinking and squinting. When distracted, he intitiates saccades towards visual stimuli in his right and left visual fields. PERRL.  III,IV, VI: ptosis not present. Exhibits bizarre roving conjugate EOM when  attempting to test smooth pursuits; when exam shifts to other systems, both saccades and visual pursuits are noted to be normal. No nystagmus noted.  V,VII: smile symmetric, facial temp sensation subjectively normal bilaterally VIII: hearing intact to conversation IX,X: palate rises symmetrically XI: bilateral shoulder shrug is symmetric XII: midline tongue extension Motor: Right : Upper extremity   5/5    Left:     Upper extremity   5/5  Lower extremity   5/5     Lower extremity   5/5 All of the above with intermittent giveway weakness and effortful affect. Normal tone throughout; no atrophy noted. Sensory: Temperature and light touch subjectively intact throughout. No extinction.  Deep Tendon Reflexes: 2+ and symmetric throughout Plantars: Right: downgoing   Left: downgoing Cerebellar: No ataxia with FNF bilaterally Gait: Deferred following LP   Lab Results: Basic Metabolic Panel:  Recent Labs Lab 06/07/16 1017 06/11/16 1801  NA 139 136  K 3.2* 3.3*  CL  100* 100*  CO2 28 28  GLUCOSE 102* 112*  BUN 5* 11  CREATININE 1.15 1.12  CALCIUM 9.7 9.1    Liver Function Tests:  Recent Labs Lab 06/11/16 1801  AST 50*  ALT 27  ALKPHOS 65  BILITOT 0.7  PROT 7.4  ALBUMIN 4.0    Recent Labs Lab 06/11/16 1801  LIPASE 28   No results for input(s): AMMONIA in the last 168 hours.  CBC:  Recent Labs Lab 06/07/16 1017 06/11/16 1801  WBC 9.2 8.0  NEUTROABS 7.0  --   HGB 15.2 14.5  HCT 43.6 41.0  MCV 89.2 87.0  PLT 182 189    Cardiac Enzymes: No results for input(s): CKTOTAL, CKMB, CKMBINDEX, TROPONINI in the last 168 hours.  Lipid Panel: No results for input(s): CHOL, TRIG, HDL, CHOLHDL, VLDL, LDLCALC in the last 168 hours.  CBG: No results for input(s): GLUCAP in the last 168 hours.  Microbiology: Results for orders placed or performed during the hospital encounter of 06/11/16  Rapid strep screen     Status: None   Collection Time: 06/11/16  6:02 PM  Result  Value Ref Range Status   Streptococcus, Group A Screen (Direct) NEGATIVE NEGATIVE Final    Comment: (NOTE) A Rapid Antigen test may result negative if the antigen level in the sample is below the detection level of this test. The FDA has not cleared this test as a stand-alone test therefore the rapid antigen negative result has reflexed to a Group A Strep culture.   CSF culture     Status: None (Preliminary result)   Collection Time: 06/11/16  8:36 PM  Result Value Ref Range Status   Specimen Description CSF  Final   Special Requests Normal  Final   Gram Stain   Final    WBC PRESENT, PREDOMINANTLY MONONUCLEAR NO ORGANISMS SEEN CYTOSPIN SMEAR    Culture PENDING  Incomplete   Report Status PENDING  Incomplete    Coagulation Studies: No results for input(s): LABPROT, INR in the last 72 hours.  Imaging: No results found.  Assessment: 1. Neurological exam without hard localizing features. Numerous inconsistencies and non-physiological findings on exam are most consistent with a psychogenic origin.  2. Recent life stressor related to STD diagnosis.  3. Recent admission for headache thought possibly due to complicated migraine. Findings on exam at that time were also thought possibly to be due in part to embellishment.  4. LP preliminary results were negative for infection or inflammation. PCR testing and Lyme titers are pending.  5. Recent MRI and MRA of head were normal.   Recommendations: 1. Outpatient follow up with his PCP for review of final CSF results.  2. Reassured patient regarding findings on exam, which were non-localizable and were discussed in the context of possible triggering by recent stressors. Also reassured patient regarding normal initial CSF labs and normal recent MRI/MRA.  3. Return to ED if symptoms recur.   Electronically signed: Dr. Caryl Pina 06/11/2016, 11:45 PM

## 2016-06-12 LAB — HIV ANTIBODY (ROUTINE TESTING W REFLEX): HIV SCREEN 4TH GENERATION: NONREACTIVE

## 2016-06-12 LAB — URINE CYTOLOGY ANCILLARY ONLY
Chlamydia: NEGATIVE
Neisseria Gonorrhea: NEGATIVE
Trichomonas: NEGATIVE

## 2016-06-12 LAB — RPR: RPR: NONREACTIVE

## 2016-06-13 LAB — ACETYLCHOLINE RECEPTOR, BINDING: Acety choline binding ab: <0.03 nmol/L (ref 0.00–0.24)

## 2016-06-13 LAB — HERPES SIMPLEX VIRUS(HSV) DNA BY PCR
HSV 1 DNA: NEGATIVE
HSV 2 DNA: NEGATIVE

## 2016-06-13 LAB — B. BURGDORFI ANTIBODIES

## 2016-06-14 LAB — HSV CULTURE AND TYPING

## 2016-06-14 LAB — CULTURE, GROUP A STREP (THRC)

## 2016-06-15 LAB — CSF CULTURE

## 2016-06-15 LAB — CSF CULTURE W GRAM STAIN
Culture: NO GROWTH
Special Requests: NORMAL

## 2016-06-16 LAB — VARICELLA-ZOSTER BY PCR: VARICELLA-ZOSTER, PCR: NEGATIVE

## 2016-06-19 LAB — IGG CSF INDEX
ALBUMIN CSF-MCNC: 15 mg/dL (ref 11–48)
Albumin: 4.1 g/dL (ref 3.5–5.5)
IGG (IMMUNOGLOBIN G), SERUM: 1029 mg/dL (ref 700–1600)
IGG CSF: 2 mg/dL (ref 0.0–8.6)
IGG INDEX CSF: 0.5 (ref 0.0–0.7)
IgG/Alb Ratio, CSF: 0.13 (ref 0.00–0.25)

## 2016-06-20 LAB — VDRL, CSF: SYPHILIS VDRL QUANT CSF: NONREACTIVE

## 2016-06-24 LAB — B. BURGDORFI ANTIBODIES, CSF

## 2017-09-01 ENCOUNTER — Emergency Department (HOSPITAL_COMMUNITY): Payer: Self-pay

## 2017-09-01 ENCOUNTER — Encounter (HOSPITAL_COMMUNITY): Payer: Self-pay | Admitting: Emergency Medicine

## 2017-09-01 ENCOUNTER — Emergency Department (HOSPITAL_COMMUNITY)
Admission: EM | Admit: 2017-09-01 | Discharge: 2017-09-01 | Disposition: A | Discharge status: Home / Self Care | Payer: Self-pay | Attending: Emergency Medicine | Admitting: Emergency Medicine

## 2017-09-01 DIAGNOSIS — B9789 Other viral agents as the cause of diseases classified elsewhere: Secondary | ICD-10-CM

## 2017-09-01 DIAGNOSIS — R197 Diarrhea, unspecified: Secondary | ICD-10-CM | POA: Insufficient documentation

## 2017-09-01 DIAGNOSIS — R112 Nausea with vomiting, unspecified: Secondary | ICD-10-CM | POA: Insufficient documentation

## 2017-09-01 DIAGNOSIS — J45909 Unspecified asthma, uncomplicated: Secondary | ICD-10-CM | POA: Insufficient documentation

## 2017-09-01 DIAGNOSIS — J069 Acute upper respiratory infection, unspecified: Secondary | ICD-10-CM | POA: Insufficient documentation

## 2017-09-01 DIAGNOSIS — Z79899 Other long term (current) drug therapy: Secondary | ICD-10-CM | POA: Insufficient documentation

## 2017-09-01 LAB — URINALYSIS, ROUTINE W REFLEX MICROSCOPIC
Bilirubin Urine: NEGATIVE
Glucose, UA: NEGATIVE mg/dL
Hgb urine dipstick: NEGATIVE
KETONES UR: NEGATIVE mg/dL
LEUKOCYTES UA: NEGATIVE
NITRITE: NEGATIVE
PH: 6 (ref 5.0–8.0)
PROTEIN: NEGATIVE mg/dL
Specific Gravity, Urine: 1.026 (ref 1.005–1.030)

## 2017-09-01 LAB — COMPREHENSIVE METABOLIC PANEL
ALBUMIN: 4.3 g/dL (ref 3.5–5.0)
ALK PHOS: 67 U/L (ref 38–126)
ALT: 17 U/L (ref 0–44)
AST: 23 U/L (ref 15–41)
Anion gap: 10 (ref 5–15)
BILIRUBIN TOTAL: 0.6 mg/dL (ref 0.3–1.2)
BUN: 5 mg/dL — AB (ref 6–20)
CO2: 27 mmol/L (ref 22–32)
Calcium: 9.5 mg/dL (ref 8.9–10.3)
Chloride: 103 mmol/L (ref 98–111)
Creatinine, Ser: 1.16 mg/dL (ref 0.61–1.24)
GFR calc Af Amer: >60 mL/min (ref >60)
GFR calc non Af Amer: >60 mL/min (ref >60)
GLUCOSE: 110 mg/dL — AB (ref 70–99)
POTASSIUM: 3.3 mmol/L — AB (ref 3.5–5.1)
Sodium: 140 mmol/L (ref 135–145)
TOTAL PROTEIN: 7.4 g/dL (ref 6.5–8.1)

## 2017-09-01 LAB — CBC
HEMATOCRIT: 45.9 % (ref 39.0–52.0)
Hemoglobin: 15.6 g/dL (ref 13.0–17.0)
MCH: 30.2 pg (ref 26.0–34.0)
MCHC: 34 g/dL (ref 30.0–36.0)
MCV: 88.8 fL (ref 78.0–100.0)
Platelets: 186 10*3/uL (ref 150–400)
RBC: 5.17 MIL/uL (ref 4.22–5.81)
RDW: 11.5 % (ref 11.5–15.5)
WBC: 5.9 10*3/uL (ref 4.0–10.5)

## 2017-09-01 LAB — LIPASE, BLOOD: Lipase: 34 U/L (ref 11–51)

## 2017-09-01 MED ORDER — IBUPROFEN 800 MG PO TABS
800.0000 mg | ORAL_TABLET | Freq: Once | ORAL | Status: AC
  Administered 2017-09-01: 800 mg via ORAL
  Filled 2017-09-01: qty 1

## 2017-09-01 MED ORDER — ONDANSETRON 4 MG PO TBDP: 4.0000 mg | ORAL_TABLET | Freq: Three times a day (TID) | ORAL | 0 refills | Status: DC | PRN

## 2017-09-01 MED ORDER — IBUPROFEN 800 MG PO TABS: 800.0000 mg | ORAL_TABLET | Freq: Three times a day (TID) | ORAL | 0 refills | Status: DC

## 2017-09-01 NOTE — Discharge Instructions (Signed)
Take the prescribed medication as directed as needed for symptom control. Make sure to rest and drink lots of fluids. Follow-up with your primary care doctor. Return to the ED for new or worsening symptoms.

## 2017-09-01 NOTE — ED Provider Notes (Signed)
Patient placed in Quick Look pathway, seen and evaluated   Chief Complaint: fever and cough  HPI:   Pt complains of a fever and bodyaches  ROS: no uti symptoms  Physical Exam:   Gen: No distress  Neuro: Awake and Alert  Skin: Warm    Focused Exam: Lungs clear Heart rrr   Initiation of care has begun. The patient has been counseled on the process, plan, and necessity for staying for the completion/evaluation, and the remainder of the medical screening examination   Osie CheeksSofia, Leslie K, PA-C 09/01/17 Cherie Dark2003    Nanavati, Ankit, MD 09/03/17 [phone]

## 2017-09-01 NOTE — ED Triage Notes (Signed)
BIB EMS from home C/O fever, nausea, and "not feeling well" X2 days.

## 2017-09-01 NOTE — ED Provider Notes (Signed)
[facility]   CSN: [id number] Arrival date & time: 09/01/17  1953     History   Chief Complaint Chief Complaint  Patient presents with  . Fever    HPI Todd Holloway is a 22 y.o. male.   Fever   Associated symptoms include cough.     22 year old male with history of asthma, HSV, presenting to the ED with fever, cough, and body aches.  Patient states symptoms started about 2 days ago.  States cough is mostly been dry but he has noticed a little bit of mucus intermittently.  He denies any sore throat, nasal congestion, or ear pain.  He did have some diarrhea and vomiting yesterday, but is been able to eat and drink today without issue.  He denies any abdominal pain.  No chest pain or shortness of breath.  His niece and nephew have recently been sick with URI type symptoms as well.  He did take some Motrin prior to arrival.  Past Medical History:  Diagnosis Date  . Asthma     Patient Active Problem List   Diagnosis Date Noted  . HSV (herpes simplex virus) anogenital infection 06/11/2016    History reviewed. No pertinent surgical history.      Home Medications    Prior to Admission medications   Medication Sig Start Date End Date Taking? Authorizing Provider  acyclovir (ZOVIRAX) 800 MG tablet Take 1 tablet (800 mg total) by mouth 5 (five) times daily. Patient not taking: Reported on 06/11/2016 06/11/16   Dorena BodoKennard, Lawrence, NP  albuterol (PROVENTIL HFA;VENTOLIN HFA) 108 (90 Base) MCG/ACT inhaler Inhale 2 puffs into the lungs every 4 (four) hours as needed for wheezing or shortness of breath. Patient not taking: Reported on 09/01/2017 02/01/16   Ward, Layla MawKristen N, DO  ondansetron (ZOFRAN ODT) 4 MG disintegrating tablet Take 1 tablet (4 mg total) by mouth every 8 (eight) hours as needed for nausea or vomiting. Patient not taking: Reported on 09/01/2017 06/11/16   Long, Arlyss RepressJoshua G, MD    Family History No family history on  file.  Social History Social History   Tobacco Use  . Smoking status: Never Smoker  . Smokeless tobacco: Never Used  Substance Use Topics  . Alcohol use: No  . Drug use: No     Allergies   Patient has no known allergies.   Review of Systems Review of Systems  Constitutional: Positive for fever.  Respiratory: Positive for cough.   Musculoskeletal: Positive for myalgias.  All other systems reviewed and are negative.    Physical Exam Updated Vital Signs BP 124/77   Pulse 75   Temp 99.1 F (37.3 C) (Oral)   Resp 14   Ht 6\' 3"  (1.905 m)   Wt 88.5 kg (195 lb)   SpO2 100%   BMI 24.37 kg/m   Physical Exam  Constitutional: He is oriented to person, place, and time. He appears well-developed and well-nourished.  HENT:  Head: Normocephalic and atraumatic.  Right Ear: Tympanic membrane and ear canal normal.  Left Ear: Tympanic membrane and ear canal normal.  Nose: Nose normal.  Mouth/Throat: Uvula is midline, oropharynx is clear and moist and mucous membranes are normal.  Eyes: Pupils are equal, round, and reactive to light. Conjunctivae and EOM are normal.  Neck: Normal range of motion.  Cardiovascular: Normal rate, regular rhythm and normal heart sounds.  Pulmonary/Chest: Effort normal and breath sounds normal. No stridor. No respiratory distress. He has no wheezes.  He has no rhonchi.  Dry cough during exam  Abdominal: Soft. Bowel sounds are normal.  Musculoskeletal: Normal range of motion.  Neurological: He is alert and oriented to person, place, and time.  Skin: Skin is warm and dry.  Psychiatric: He has a normal mood and affect.  Nursing note and vitals reviewed.    ED Treatments / Results  Labs (all labs ordered are listed, but only abnormal results are displayed) Labs Reviewed  COMPREHENSIVE METABOLIC PANEL - Abnormal; Notable for the following components:      Result Value   Potassium 3.3 (*)    Glucose, Bld 110 (*)    BUN 5 (*)    All other  components within normal limits  URINALYSIS, ROUTINE W REFLEX MICROSCOPIC - Abnormal; Notable for the following components:   Color, Urine AMBER (*)    All other components within normal limits  LIPASE, BLOOD  CBC    EKG None  Radiology Dg Chest 2 View  Result Date: 09/01/2017 CLINICAL DATA:  Cough, shortness of breath, fever EXAM: CHEST - 2 VIEW COMPARISON:  None. FINDINGS: Heart and mediastinal contours are within normal limits. No focal opacities or effusions. No acute bony abnormality. IMPRESSION: No active cardiopulmonary disease. Electronically Signed   By: Charlett Nose M.D.   On: 09/01/2017 20:39    Procedures Procedures (including critical care time)  Medications Ordered in ED Medications - No data to display   Initial Impression / Assessment and Plan / ED Course  I have reviewed the triage vital signs and the nursing notes.  Pertinent labs & imaging results that were available during my care of the patient were reviewed by me and considered in my medical decision making (see chart for details).  22 year old male here with fever, cough, nausea, vomiting, and diarrhea for the past 2 days.  Has been around he said nephew they were sick with URI type symptoms as well.  He is afebrile and nontoxic in appearance here.  His exam is overall benign.  Lungs are clear without any wheezes or rhonchi.  Abdomen is soft and benign.  Screening labs obtained from triage are overall reassuring.  Chest x-ray is clear.  Patient overall appears well, is tolerating oral fluids well at this time.  Suspect constellation of symptoms likely viral in nature.  Discussed supportive care with symptom control at home.  Encouraged fluids, rest.  Can follow-up with PCP.  Discussed plan with patient, he acknowledged understanding and agreed with plan of care.  Return precautions given for new or worsening symptoms.  Final Clinical Impressions(s) / ED Diagnoses   Final diagnoses:  Viral URI with cough   Nausea vomiting and diarrhea    ED Discharge Orders        Ordered    ibuprofen (ADVIL,MOTRIN) 800 MG tablet  3 times daily     09/01/17 2249    ondansetron (ZOFRAN ODT) 4 MG disintegrating tablet  Every 8 hours PRN     09/01/17 2249       Garlon Hatchet, PA-C 09/02/17 0256    Wynetta Fines, MD 09/03/17 [id number]

## 2017-10-30 ENCOUNTER — Other Ambulatory Visit: Payer: Self-pay

## 2017-10-30 ENCOUNTER — Encounter (HOSPITAL_COMMUNITY): Payer: Self-pay | Admitting: Emergency Medicine

## 2017-10-30 ENCOUNTER — Emergency Department (HOSPITAL_COMMUNITY)
Admission: EM | Admit: 2017-10-30 | Discharge: 2017-10-30 | Disposition: A | Discharge status: Home / Self Care | Payer: Medicaid Other | Attending: Emergency Medicine | Admitting: Emergency Medicine

## 2017-10-30 DIAGNOSIS — R51 Headache: Secondary | ICD-10-CM

## 2017-10-30 DIAGNOSIS — R519 Headache, unspecified: Secondary | ICD-10-CM

## 2017-10-30 DIAGNOSIS — J45909 Unspecified asthma, uncomplicated: Secondary | ICD-10-CM | POA: Insufficient documentation

## 2017-10-30 DIAGNOSIS — K047 Periapical abscess without sinus: Secondary | ICD-10-CM | POA: Insufficient documentation

## 2017-10-30 MED ORDER — PENICILLIN V POTASSIUM 500 MG PO TABS: 500.0000 mg | ORAL_TABLET | Freq: Four times a day (QID) | ORAL | 0 refills | Status: AC

## 2017-10-30 NOTE — ED Notes (Signed)
Pt verbalized understanding of discharge instructions and denies any further questions at this time.   Pt walked out to the lobby to wait for his ride. Staff made aware.

## 2017-10-30 NOTE — Discharge Instructions (Addendum)
You were seen in the ER for right-sided facial and gum pain.  You have a very small abscess to the upper gumline.  This will be treated with penicillin.  Take 600 mg of ibuprofen every 8 hours for pain.  If you need more pain control you can take 500 to 1000 mg of acetaminophen every 6-8 hours.  Finish antibiotics as prescribed.  Follow-up with a dentist for reevaluation of routine dental care.  Return to the ER for gum or facial swelling, redness, warmth, fevers, difficulty opening your jaw, shortness of breath.  Unfortunately, dental pain will continue until you have a full dental evaluation and treatment.  See dental resources attached.  Additionally, I have given you Dr. Mayford Knifeurner and Dr. Leanord AsalFarless contact information, they frequently try to accommodate patients from the Er.  Some of the pain you are having may be from recent minor trauma to the ear/head.  Ice.  Take ibuprofen and acetaminophen as above.  Return for vision changes, nausea, vomiting, facial drooping, numbness.

## 2017-10-30 NOTE — ED Triage Notes (Signed)
Pt was hit in right side of face by elbow when trying to dunk ball. He did it twice. He states that tooth and jaw hurt. He can open it up all the way but painful.

## 2017-10-30 NOTE — ED Provider Notes (Signed)
[facility]   CSN: [id number] Arrival date & time: 10/30/17  1220     History   Chief Complaint Chief Complaint  Patient presents with  . Facial Pain    HPI Todd Holloway is a 22 y.o. male is here for evaluation of right-sided facial/jaw pain that began 2 days ago.  Pain is throbbing, constant, severe, radiating to his entire right face and scalp.  Pain is worse with direct palpation of the area, chewing, opening his mouth.  Has noticed some swelling to the right upper gumline.  Initially thought that pain was from getting elbowed on the right side of his head 2 days ago while playing basketball.  States he fell to the ground but did not have significant or sudden headache, vision changes, nausea, vomiting, vision changes, LOC.  Patient took 2 of his mother's gabapentin medications prior to arrival, states that he is "feeling it" since arrival to the ER has been more drowsy and feeling "high".  He does not typically take any medications.  He denies other illicit drug or EtOH use prior to arrival.  HPI  Past Medical History:  Diagnosis Date  . Asthma     Patient Active Problem List   Diagnosis Date Noted  . HSV (herpes simplex virus) anogenital infection 06/11/2016    History reviewed. No pertinent surgical history.      Home Medications    Prior to Admission medications   Medication Sig Start Date End Date Taking? Authorizing Provider  acyclovir (ZOVIRAX) 800 MG tablet Take 1 tablet (800 mg total) by mouth 5 (five) times daily. Patient not taking: Reported on 06/11/2016 06/11/16   Dorena BodoKennard, Lawrence, NP  albuterol (PROVENTIL HFA;VENTOLIN HFA) 108 (90 Base) MCG/ACT inhaler Inhale 2 puffs into the lungs every 4 (four) hours as needed for wheezing or shortness of breath. Patient not taking: Reported on 09/01/2017 02/01/16   Ward, Layla MawKristen N, DO  ibuprofen (ADVIL,MOTRIN) 800 MG tablet Take 1 tablet (800 mg total) by mouth 3  (three) times daily. 09/01/17   Garlon HatchetSanders, Lisa M, PA-C  ondansetron (ZOFRAN ODT) 4 MG disintegrating tablet Take 1 tablet (4 mg total) by mouth every 8 (eight) hours as needed for nausea. 09/01/17   Garlon HatchetSanders, Lisa M, PA-C  penicillin v potassium (VEETID) 500 MG tablet Take 1 tablet (500 mg total) by mouth 4 (four) times daily for 7 days. 10/30/17 11/06/17  Liberty HandyGibbons, Taichi Repka J, PA-C    Family History History reviewed. No pertinent family history.  Social History Social History   Tobacco Use  . Smoking status: Never Smoker  . Smokeless tobacco: Never Used  Substance Use Topics  . Alcohol use: No  . Drug use: No     Allergies   Patient has no known allergies.   Review of Systems Review of Systems  HENT:       Facial and gum pain   All other systems reviewed and are negative.    Physical Exam Updated Vital Signs BP 123/79 (BP Location: Right Arm)   Pulse 74   Temp 99.4 F (37.4 C) (Oral)   Resp 18   SpO2 100%   Physical Exam  Constitutional: He is oriented to person, place, and time. He appears well-developed and well-nourished. No distress.  NAD.  HENT:  Head: Normocephalic and atraumatic.  Right Ear: External ear normal.  Left Ear: External ear normal.  Nose: Nose normal.  No obvious deformity to facial, orbital, nasal, scalp bones.  Mild  right maxillary tenderness w/o edema, erythema, fluctuance, skin injury.  Nasal mucosa normal without bleeding, septum midline Tiny pustule to apex/upper gumline of right canine/incissor, tender, easily drained purulent drainage with pressure.  Eyes: Conjunctivae are normal. No scleral icterus.  PERRL and EOMs intact.   Neck: Normal range of motion. Neck supple.  No midline or paraspinal muscle tenderness of c-spine  Cardiovascular: Normal rate, regular rhythm, normal heart sounds and intact distal pulses.  Pulmonary/Chest: Effort normal and breath sounds normal.  Musculoskeletal: Normal range of motion. He exhibits no deformity.    Neurological: He is alert and oriented to person, place, and time.  Skin: Skin is warm and dry. Capillary refill takes less than 2 seconds.  Psychiatric: He has a normal mood and affect. His behavior is normal. Judgment and thought content normal.  Nursing note and vitals reviewed.    ED Treatments / Results  Labs (all labs ordered are listed, but only abnormal results are displayed) Labs Reviewed - No data to display  EKG None  Radiology No results found.  Procedures Procedures (including critical care time)  Medications Ordered in ED Medications - No data to display   Initial Impression / Assessment and Plan / ED Course  I have reviewed the triage vital signs and the nursing notes.  Pertinent labs & imaging results that were available during my care of the patient were reviewed by me and considered in my medical decision making (see chart for details).    Exam most consistent with apical dental abscess this most likely what is causing pt's pain.  Given recent trauma to that side this could also be due to mild facial/scalp soreness from trauma.  He has no signs of significant facial, nasal, orbital, scalp or c-spine injury and I do not think emergent imaging is indicated today.  Will plan on dc with penicillin for dental abscess and f/u with dentist.  Discussed return precautions.    1545: As I was walking by room pt appeared to be asleep/slumped over the chair.  I spoke with pt who states the medicine he took (gabapentin) is making him sleepy.  He appears drowsy and keeps his eyes closed during exam but otherwise responding all questions appropriately, joking. He denies pain, nausea, CP.  He adamately denies other illicit drug use/ETOH intake.  VSS.  PERRL intact.  Slow cautious gait but able to ambulate on his own.  No family able to pick patient up. Will plan on observation.  At this time, given that there is a clear cause to symptoms (gabapentin) feel observation is  sufficient.  Will hold off on emergent blood work at this time unless there is clinical deterioration.   1445: Re-evaluated pt. He is more alert, still drowsy but ambulatory.  VSS. Pt's father's friend will pick him up.  DC instructions and return precautions discussed and included in AVS.  Final Clinical Impressions(s) / ED Diagnoses   Final diagnoses:  Dental abscess  Facial pain    ED Discharge Orders         Ordered    penicillin v potassium (VEETID) 500 MG tablet  4 times daily     10/30/17 1338           Jerrell Mylar 10/30/17 1446    Donnetta Hutching, MD 10/31/17 1223

## 2017-10-30 NOTE — ED Notes (Signed)
Pt appears to be very relaxed. He reports "they gave me some g-a-b". He reports taking gabapentin for nerve pain in his mouth. He will need a ride home at discharge.

## 2018-03-11 ENCOUNTER — Ambulatory Visit (INDEPENDENT_AMBULATORY_CARE_PROVIDER_SITE_OTHER): Payer: Self-pay

## 2018-03-11 ENCOUNTER — Encounter (HOSPITAL_COMMUNITY): Payer: Self-pay

## 2018-03-11 ENCOUNTER — Other Ambulatory Visit: Payer: Self-pay

## 2018-03-11 ENCOUNTER — Emergency Department (HOSPITAL_COMMUNITY)
Admission: EM | Admit: 2018-03-11 | Discharge: 2018-03-12 | Disposition: A | Discharge status: Home / Self Care | Payer: Medicaid Other | Attending: Emergency Medicine | Admitting: Emergency Medicine

## 2018-03-11 ENCOUNTER — Encounter (HOSPITAL_COMMUNITY): Payer: Self-pay | Admitting: Emergency Medicine

## 2018-03-11 ENCOUNTER — Ambulatory Visit (HOSPITAL_COMMUNITY)
Admission: EM | Admit: 2018-03-11 | Discharge: 2018-03-11 | Disposition: A | Discharge status: Home / Self Care | Payer: Medicaid Other | Attending: Family Medicine | Admitting: Family Medicine

## 2018-03-11 DIAGNOSIS — R05 Cough: Secondary | ICD-10-CM | POA: Insufficient documentation

## 2018-03-11 DIAGNOSIS — J209 Acute bronchitis, unspecified: Secondary | ICD-10-CM

## 2018-03-11 DIAGNOSIS — Z5321 Procedure and treatment not carried out due to patient leaving prior to being seen by health care provider: Secondary | ICD-10-CM | POA: Insufficient documentation

## 2018-03-11 MED ORDER — AZITHROMYCIN 250 MG PO TABS: 250.0000 mg | ORAL_TABLET | Freq: Every day | ORAL | 0 refills | Status: DC

## 2018-03-11 MED ORDER — PREDNISONE 50 MG PO TABS: 50.0000 mg | ORAL_TABLET | Freq: Every day | ORAL | 0 refills | Status: AC

## 2018-03-11 MED ORDER — ALBUTEROL SULFATE HFA 108 (90 BASE) MCG/ACT IN AERS: 1.0000 | INHALATION_SPRAY | Freq: Four times a day (QID) | RESPIRATORY_TRACT | 0 refills | Status: DC | PRN

## 2018-03-11 MED ORDER — PSEUDOEPH-BROMPHEN-DM 30-2-10 MG/5ML PO SYRP: 5.0000 mL | ORAL_SOLUTION | Freq: Four times a day (QID) | ORAL | 0 refills | Status: DC | PRN

## 2018-03-11 NOTE — ED Triage Notes (Signed)
Pt states has had a cough for the past 2wks. 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 EMS came out gave oxygen and told him to come here today for a chest xray. Pt states out of his inhaler

## 2018-03-11 NOTE — ED Provider Notes (Signed)
[facility]    CSN: [id number] Arrival date & time: 03/11/18  0809     History   Chief Complaint Chief Complaint  Patient presents with  . Cough    HPI Myrle ShengKaysaun T Pepitone is a 23 y.o. male history of asthma presenting today for evaluation of a cough.  Patient states that he has had a cough for approximately 2 weeks.  He has had some mild rhinorrhea and nasal congestion with this as well.  Denies any sore throat.  Denies any fevers.  He states that he became concerned as last night as he was playing basketball he became short of breath and then had an argument with his girlfriend.  Afterward he felt extremely short of breath, weak and lightheaded.  Denies passing out or loss of consciousness.  He was evaluated by EMS and gave him oxygen and an albuterol treatment.  He was recommended today to come have a chest x-ray.  He does not have an inhaler.  HPI  Past Medical History:  Diagnosis Date  . Asthma     Patient Active Problem List   Diagnosis Date Noted  . HSV (herpes simplex virus) anogenital infection 06/11/2016    History reviewed. No pertinent surgical history.     Home Medications    Prior to Admission medications   Medication Sig Start Date End Date Taking? Authorizing Provider  albuterol (PROVENTIL HFA;VENTOLIN HFA) 108 (90 Base) MCG/ACT inhaler Inhale 1-2 puffs into the lungs every 6 (six) hours as needed for wheezing or shortness of breath. 03/11/18   Kamylle Axelson C, PA-C  azithromycin (ZITHROMAX) 250 MG tablet Take 1 tablet (250 mg total) by mouth daily. Take first 2 tablets together, then 1 every day until finished. 03/11/18   Kiasha Bellin C, PA-C  brompheniramine-pseudoephedrine-DM 30-2-10 MG/5ML syrup Take 5 mLs by mouth 4 (four) times daily as needed. 03/11/18   Whitfield Dulay C, PA-C  ibuprofen (ADVIL,MOTRIN) 800 MG tablet Take 1 tablet (800 mg total) by mouth 3 (three) times daily. 09/01/17   Garlon HatchetSanders, Lisa M, PA-C  predniSONE (DELTASONE) 50 MG  tablet Take 1 tablet (50 mg total) by mouth daily for 5 days. 03/11/18 03/16/18  Tabius Rood, Junius CreamerHallie C, PA-C    Family History No family history on file.  Social History Social History   Tobacco Use  . Smoking status: Never Smoker  . Smokeless tobacco: Never Used  Substance Use Topics  . Alcohol use: No  . Drug use: No     Allergies   Patient has no known allergies.   Review of Systems Review of Systems  Constitutional: Negative for activity change, appetite change, chills, fatigue and fever.  HENT: Positive for congestion and rhinorrhea. Negative for ear pain, sinus pressure, sore throat and trouble swallowing.   Eyes: Negative for discharge and redness.  Respiratory: Positive for cough, chest tightness and shortness of breath. Negative for wheezing.   Cardiovascular: Negative for chest pain.  Gastrointestinal: Negative for abdominal pain, diarrhea, nausea and vomiting.  Musculoskeletal: Negative for myalgias.  Skin: Negative for rash.  Neurological: Negative for dizziness, light-headedness and headaches.     Physical Exam Triage Vital Signs ED Triage Vitals  Enc Vitals Group     BP 03/11/18 0832 125/87     Pulse Rate 03/11/18 0832 91     Resp 03/11/18 0832 18     Temp 03/11/18 0832 98.3 F (36.8 C)     Temp Source 03/11/18 0832 Oral     SpO2 03/11/18 0832 98 %  Weight --      Height --      Head Circumference --      Peak Flow --      Pain Score 03/11/18 0833 0     Pain Loc --      Pain Edu? --      Excl. in GC? --    No data found.  Updated Vital Signs BP 125/87 (BP Location: Left Arm)   Pulse 91   Temp 98.3 F (36.8 C) (Oral)   Resp 18   SpO2 98%   Visual Acuity Right Eye Distance:   Left Eye Distance:   Bilateral Distance:    Right Eye Near:   Left Eye Near:    Bilateral Near:     Physical Exam Vitals signs and nursing note reviewed.  Constitutional:      Appearance: He is well-developed.  HENT:     Head: Normocephalic and atraumatic.       Ears:     Comments: Bilateral ears without tenderness to palpation of external auricle, tragus and mastoid, EAC's without erythema or swelling, TM's with good bony landmarks and cone of light. Non erythematous.    Mouth/Throat:     Comments: Oral mucosa pink and moist, no tonsillar enlargement or exudate. Posterior pharynx patent and nonerythematous, no uvula deviation or swelling. Normal phonation. Eyes:     Conjunctiva/sclera: Conjunctivae normal.  Neck:     Musculoskeletal: Neck supple.  Cardiovascular:     Rate and Rhythm: Normal rate and regular rhythm.     Heart sounds: No murmur.  Pulmonary:     Effort: Pulmonary effort is normal. No respiratory distress.     Breath sounds: Normal breath sounds.     Comments: Breathing comfortably at rest, CTABL, no wheezing, rales or other adventitious sounds auscultated Abdominal:     Palpations: Abdomen is soft.     Tenderness: There is no abdominal tenderness.  Skin:    General: Skin is warm and dry.  Neurological:     Mental Status: He is alert.      UC Treatments / Results  Labs (all labs ordered are listed, but only abnormal results are displayed) Labs Reviewed - No data to display  EKG None  Radiology Dg Chest 2 View  Result Date: 03/11/2018 CLINICAL DATA:  Cough for 2 weeks.  Shortness of breath. EXAM: CHEST - 2 VIEW COMPARISON:  Two-view chest x-ray 09/01/2017 FINDINGS: The heart size and mediastinal contours are within normal limits. Both lungs are clear. The visualized skeletal structures are unremarkable. IMPRESSION: Negative two view chest x-ray. Electronically Signed   By: Marin Robertshristopher  Mattern M.D.   On: 03/11/2018 09:41    Procedures Procedures (including critical care time)  Medications Ordered in UC Medications - No data to display  Initial Impression / Assessment and Plan / UC Course  I have reviewed the triage vital signs and the nursing notes.  Pertinent labs & imaging results that were available  during my care of the patient were reviewed by me and considered in my medical decision making (see chart for details).     Chest x-ray negative for pneumonia.  Will treat patient for bronchitis, given length of symptoms will cover for atypical respiratory illness with azithromycin.  Prednisone daily as well as provided albuterol inhaler.  Cough syrup as needed for congestion and cough.  Continue to monitor, push fluids and rest.  Advised not to over exert himself with playing basketball.  Continue to monitor,Discussed strict return precautions.  Patient verbalized understanding and is agreeable with plan.  Final Clinical Impressions(s) / UC Diagnoses   Final diagnoses:  Acute bronchitis, unspecified organism     Discharge Instructions     I am treating you for bronchitis Begin azithromycin- 2 tabs today, 1 tablet for the following 4 days Prednisone daily for 5 days to help with asthma, cough Albuterol inhaler as needed for shortness of breath, wheezing, chest tightness Cough syrup as needed for cough and congestion  Follow up if symptoms still persisting or worsening    ED Prescriptions    Medication Sig Dispense Auth. Provider   albuterol (PROVENTIL HFA;VENTOLIN HFA) 108 (90 Base) MCG/ACT inhaler Inhale 1-2 puffs into the lungs every 6 (six) hours as needed for wheezing or shortness of breath. 1 Inhaler Emelio Schneller C, PA-C   predniSONE (DELTASONE) 50 MG tablet Take 1 tablet (50 mg total) by mouth daily for 5 days. 5 tablet Raigan Baria C, PA-C   azithromycin (ZITHROMAX) 250 MG tablet Take 1 tablet (250 mg total) by mouth daily. Take first 2 tablets together, then 1 every day until finished. 6 tablet Jasmyne Lodato C, PA-C   brompheniramine-pseudoephedrine-DM 30-2-10 MG/5ML syrup Take 5 mLs by mouth 4 (four) times daily as needed. 120 mL Eaton Folmar C, PA-C     Controlled Substance Prescriptions [place] Controlled Substance Registry consulted? Not Applicable   Lew Dawes, [place] 03/11/18 0945

## 2018-03-11 NOTE — ED Triage Notes (Signed)
Per [facility], Pt reports cough, dx today with bronchitis did not get his medications filled. Pt in no acute distress in triage

## 2018-03-11 NOTE — Discharge Instructions (Signed)
I am treating you for bronchitis Begin azithromycin- 2 tabs today, 1 tablet for the following 4 days Prednisone daily for 5 days to help with asthma, cough Albuterol inhaler as needed for shortness of breath, wheezing, chest tightness Cough syrup as needed for cough and congestion  Follow up if symptoms still persisting or worsening

## 2018-03-12 NOTE — ED Notes (Signed)
No answer for treatment room. 

## 2018-03-19 ENCOUNTER — Ambulatory Visit (HOSPITAL_COMMUNITY): Admission: EM | Admit: 2018-03-19 | Discharge: 2018-03-19 | Discharge status: Absent without leave | Payer: Self-pay

## 2018-03-19 NOTE — ED Triage Notes (Signed)
Pt has been called mult times and no answer.

## 2018-03-19 NOTE — ED Notes (Signed)
No answer in lobby.

## 2018-03-19 NOTE — ED Notes (Signed)
Patient called multiple times in lobby.  Patient not in restrooms or outside.

## 2018-04-04 ENCOUNTER — Emergency Department (HOSPITAL_COMMUNITY)
Admission: EM | Admit: 2018-04-04 | Discharge: 2018-04-04 | Disposition: A | Discharge status: Home / Self Care | Payer: Self-pay | Attending: Emergency Medicine | Admitting: Emergency Medicine

## 2018-04-04 ENCOUNTER — Encounter (HOSPITAL_COMMUNITY): Payer: Self-pay

## 2018-04-04 ENCOUNTER — Emergency Department (HOSPITAL_COMMUNITY): Payer: Self-pay

## 2018-04-04 DIAGNOSIS — R109 Unspecified abdominal pain: Secondary | ICD-10-CM | POA: Insufficient documentation

## 2018-04-04 DIAGNOSIS — M7918 Myalgia, other site: Secondary | ICD-10-CM

## 2018-04-04 DIAGNOSIS — J45909 Unspecified asthma, uncomplicated: Secondary | ICD-10-CM | POA: Insufficient documentation

## 2018-04-04 DIAGNOSIS — Z79899 Other long term (current) drug therapy: Secondary | ICD-10-CM | POA: Insufficient documentation

## 2018-04-04 LAB — CBC WITH DIFFERENTIAL/PLATELET
Abs Immature Granulocytes: 0.01 10*3/uL (ref 0.00–0.07)
BASOS ABS: 0 10*3/uL (ref 0.0–0.1)
Basophils Relative: 0 %
Eosinophils Absolute: 0.1 10*3/uL (ref 0.0–0.5)
Eosinophils Relative: 2 %
HEMATOCRIT: 45.9 % (ref 39.0–52.0)
HEMOGLOBIN: 15.5 g/dL (ref 13.0–17.0)
Immature Granulocytes: 0 %
LYMPHS ABS: 1.7 10*3/uL (ref 0.7–4.0)
LYMPHS PCT: 28 %
MCH: 30.6 pg (ref 26.0–34.0)
MCHC: 33.8 g/dL (ref 30.0–36.0)
MCV: 90.7 fL (ref 80.0–100.0)
Monocytes Absolute: 0.7 10*3/uL (ref 0.1–1.0)
Monocytes Relative: 12 %
NEUTROS PCT: 58 %
NRBC: 0 % (ref 0.0–0.2)
Neutro Abs: 3.3 10*3/uL (ref 1.7–7.7)
Platelets: 227 10*3/uL (ref 150–400)
RBC: 5.06 MIL/uL (ref 4.22–5.81)
RDW: 12.1 % (ref 11.5–15.5)
WBC: 5.9 10*3/uL (ref 4.0–10.5)

## 2018-04-04 LAB — BASIC METABOLIC PANEL
Anion gap: 8 (ref 5–15)
BUN: 5 mg/dL — ABNORMAL LOW (ref 6–20)
CHLORIDE: 105 mmol/L (ref 98–111)
CO2: 28 mmol/L (ref 22–32)
Calcium: 9.7 mg/dL (ref 8.9–10.3)
Creatinine, Ser: 1.01 mg/dL (ref 0.61–1.24)
GFR calc non Af Amer: >60 mL/min (ref >60)
Glucose, Bld: 97 mg/dL (ref 70–99)
POTASSIUM: 3.8 mmol/L (ref 3.5–5.1)
Sodium: 141 mmol/L (ref 135–145)

## 2018-04-04 MED ORDER — HYDROCODONE-ACETAMINOPHEN 5-325 MG PO TABS
1.0000 | ORAL_TABLET | Freq: Once | ORAL | Status: AC
  Administered 2018-04-04: 1 via ORAL
  Filled 2018-04-04: qty 1

## 2018-04-04 MED ORDER — SODIUM CHLORIDE 0.9 % IV BOLUS
500.0000 mL | Freq: Once | INTRAVENOUS | Status: AC
  Administered 2018-04-04: 500 mL via INTRAVENOUS

## 2018-04-04 MED ORDER — IOHEXOL 300 MG/ML  SOLN
100.0000 mL | Freq: Once | INTRAMUSCULAR | Status: AC | PRN
  Administered 2018-04-04: 100 mL via INTRAVENOUS

## 2018-04-04 NOTE — ED Notes (Signed)
Patient verbalizes understanding of discharge instructions. Opportunity for questioning and answers were provided. Armband removed by staff, pt discharged from ED. Pt ambulatory to lobby. Follow up care reviewed 
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
## 2018-04-04 NOTE — Discharge Instructions (Addendum)
You have been diagnosed today with Abdominal Muscle Pain.  At this time there does not appear to be the presence of an emergent medical condition, however there is always the potential for conditions to change. Please read and follow the below instructions.  Please return to the Emergency Department immediately for any new or worsening symptoms. Please be sure to follow up with your Primary Care Provider within one week regarding your visit today; please call their office to schedule an appointment even if you are feeling better for a follow-up visit. You may use over-the-counter anti-inflammatory medications such as Tylenol or ibuprofen as directed on the packaging to help with your symptoms. Please get plenty of rest and drink plenty of water over the next few days to help with your symptoms.  Get help right away if: Your symptoms return. You have any of these: Blood in your urine or your bowel movements (stool). Pain in your abdomen. Trouble with breathing. Chest pain. A fever. Dizziness. You vomit blood. You pass out.  Please read the additional information packets attached to your discharge summary.  Do not take your medicine if  develop an itchy rash, swelling in your mouth or lips, or difficulty breathing.

## 2018-04-23 ENCOUNTER — Ambulatory Visit (HOSPITAL_COMMUNITY)
Admission: EM | Admit: 2018-04-23 | Discharge: 2018-04-23 | Disposition: A | Discharge status: Home / Self Care | Payer: Medicaid Other | Attending: Family Medicine | Admitting: Family Medicine

## 2018-04-23 ENCOUNTER — Ambulatory Visit (INDEPENDENT_AMBULATORY_CARE_PROVIDER_SITE_OTHER): Payer: Medicaid Other

## 2018-04-23 DIAGNOSIS — S99911A Unspecified injury of right ankle, initial encounter: Secondary | ICD-10-CM

## 2018-04-23 DIAGNOSIS — Y9367 Activity, basketball: Secondary | ICD-10-CM

## 2018-04-23 DIAGNOSIS — S93401A Sprain of unspecified ligament of right ankle, initial encounter: Secondary | ICD-10-CM

## 2018-04-23 MED ORDER — IBUPROFEN 800 MG PO TABS: 800.0000 mg | ORAL_TABLET | Freq: Three times a day (TID) | ORAL | 0 refills | Status: DC

## 2018-04-23 NOTE — Discharge Instructions (Signed)
Ice  Elevate Use crutches until pain improves Wear brace until you can walk comfortably Take the ibuprofen 3 x a day for pain See orthopedic if not improving in a week

## 2018-04-23 NOTE — ED Provider Notes (Signed)
[facility]    CSN: [id number] Arrival date & time: 04/23/18  5449     History   Chief Complaint No chief complaint on file.   HPI Todd Holloway is a 23 y.o. male.   HPI   Was playing basketball on Tuesday the 10th and landed on the edge of his foot and inverted his ankle, fell to ground.  Immediately painful.  Has not been able to bear weight comfortable since. No prior ankle problems or fracture.   Past Medical History:  Diagnosis Date  . Asthma     Patient Active Problem List   Diagnosis Date Noted  . HSV (herpes simplex virus) anogenital infection 06/11/2016    No past surgical history on file.     Home Medications    Prior to Admission medications   Medication Sig Start Date End Date Taking? Authorizing Provider  albuterol (PROVENTIL HFA;VENTOLIN HFA) 108 (90 Base) MCG/ACT inhaler Inhale 1-2 puffs into the lungs every 6 (six) hours as needed for wheezing or shortness of breath. 03/11/18   Wieters, Hallie C, PA-C  ibuprofen (ADVIL,MOTRIN) 800 MG tablet Take 1 tablet (800 mg total) by mouth 3 (three) times daily. 04/23/18   Eustace Moore, MD    Family History No family history on file.  Social History Social History   Tobacco Use  . Smoking status: Never Smoker  . Smokeless tobacco: Never Used  Substance Use Topics  . Alcohol use: No  . Drug use: No     Allergies   Patient has no known allergies.   Review of Systems Review of Systems  Constitutional: Negative for chills and fever.  HENT: Negative for ear pain and sore throat.   Eyes: Negative for pain and visual disturbance.  Respiratory: Negative for cough and shortness of breath.   Cardiovascular: Negative for chest pain and palpitations.  Gastrointestinal: Negative for abdominal pain and vomiting.  Genitourinary: Negative for dysuria and hematuria.  Musculoskeletal: Positive for arthralgias and gait problem. Negative for back pain.  Skin: Negative for color change and  rash.  Neurological: Negative for seizures and syncope.  All other systems reviewed and are negative.    Physical Exam Triage Vital Signs ED Triage Vitals  Enc Vitals Group     BP      Pulse      Resp      Temp      Temp src      SpO2      Weight      Height      Head Circumference      Peak Flow      Pain Score      Pain Loc      Pain Edu?      Excl. in GC?    No data found.  Updated Vital Signs There were no vitals taken for this visit.  Visual Acuity Right Eye Distance:   Left Eye Distance:   Bilateral Distance:    Right Eye Near:   Left Eye Near:    Bilateral Near:     Physical Exam Constitutional:      General: He is not in acute distress.    Appearance: He is well-developed.  HENT:     Head: Normocephalic and atraumatic.  Eyes:     Conjunctiva/sclera: Conjunctivae normal.     Pupils: Pupils are equal, round, and reactive to light.  Neck:     Musculoskeletal: Normal range of motion.  Cardiovascular:  Rate and Rhythm: Normal rate.  Pulmonary:     Effort: Pulmonary effort is normal. No respiratory distress.  Abdominal:     General: There is no distension.     Palpations: Abdomen is soft.  Musculoskeletal: Normal range of motion.        General: Swelling, tenderness and signs of injury present. No deformity.     Comments: Right ankle has pain with ROM especially inversion, tender lateral ankle malleolus, tender 5th mt proximal, anterior ankle joint  Skin:    General: Skin is warm and dry.  Neurological:     General: No focal deficit present.     Mental Status: He is alert.  Psychiatric:        Mood and Affect: Mood normal.        Behavior: Behavior normal.      UC Treatments / Results  Labs (all labs ordered are listed, but only abnormal results are displayed) Labs Reviewed - No data to display  EKG None  Radiology Dg Ankle Complete Right  Result Date: 04/23/2018 CLINICAL DATA:  Acute RIGHT ankle pain following injury 3 days ago.  Initial encounter. EXAM: RIGHT ANKLE - COMPLETE 3+ VIEW COMPARISON:  05/27/2011 FINDINGS: There is no evidence of fracture, dislocation, or joint effusion. There is no evidence of arthropathy or other focal bone abnormality. Soft tissues are unremarkable. IMPRESSION: Negative. Electronically Signed   By: Harmon Pier M.D.   On: 04/23/2018 10:38    Procedures Procedures (including critical care time)  Medications Ordered in UC Medications - No data to display  Initial Impression / Assessment and Plan / UC Course  I have reviewed the triage vital signs and the nursing notes.  Pertinent labs & imaging results that were available during my care of the patient were reviewed by me and considered in my medical decision making (see chart for details).     Ankle sprain - no fracture Reviewed recovery expected Exercises Gradual return to weight bearing Final Clinical Impressions(s) / UC Diagnoses   Final diagnoses:  Moderate ankle sprain, right, initial encounter     Discharge Instructions     Ice  Elevate Use crutches until pain improves Wear brace until you can walk comfortably Take the ibuprofen 3 x a day for pain See orthopedic if not improving in a week      ED Prescriptions    Medication Sig Dispense Auth. Provider   ibuprofen (ADVIL,MOTRIN) 800 MG tablet Take 1 tablet (800 mg total) by mouth 3 (three) times daily. 21 tablet Eustace Moore, MD     Controlled Substance Prescriptions [place] Controlled Substance Registry consulted? Not Applicable   Eustace Moore, MD 04/23/18 1309

## 2018-08-18 ENCOUNTER — Emergency Department (HOSPITAL_COMMUNITY)
Admission: EM | Admit: 2018-08-18 | Discharge: 2018-08-18 | Disposition: A | Discharge status: Home / Self Care | Payer: Self-pay | Attending: Emergency Medicine | Admitting: Emergency Medicine

## 2018-08-18 ENCOUNTER — Other Ambulatory Visit: Payer: Self-pay

## 2018-08-18 ENCOUNTER — Encounter (HOSPITAL_COMMUNITY): Payer: Self-pay

## 2018-08-18 ENCOUNTER — Emergency Department (HOSPITAL_COMMUNITY): Payer: Self-pay

## 2018-08-18 DIAGNOSIS — R05 Cough: Secondary | ICD-10-CM | POA: Insufficient documentation

## 2018-08-18 DIAGNOSIS — R51 Headache: Secondary | ICD-10-CM | POA: Insufficient documentation

## 2018-08-18 DIAGNOSIS — E119 Type 2 diabetes mellitus without complications: Secondary | ICD-10-CM | POA: Insufficient documentation

## 2018-08-18 DIAGNOSIS — R0602 Shortness of breath: Secondary | ICD-10-CM | POA: Insufficient documentation

## 2018-08-18 DIAGNOSIS — Z20822 Contact with and (suspected) exposure to covid-19: Secondary | ICD-10-CM

## 2018-08-18 DIAGNOSIS — M791 Myalgia, unspecified site: Secondary | ICD-10-CM | POA: Insufficient documentation

## 2018-08-18 DIAGNOSIS — R197 Diarrhea, unspecified: Secondary | ICD-10-CM | POA: Insufficient documentation

## 2018-08-18 DIAGNOSIS — Z20828 Contact with and (suspected) exposure to other viral communicable diseases: Secondary | ICD-10-CM | POA: Insufficient documentation

## 2018-08-18 DIAGNOSIS — J45909 Unspecified asthma, uncomplicated: Secondary | ICD-10-CM | POA: Insufficient documentation
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
## 2018-08-18 NOTE — Discharge Instructions (Signed)
You were seen in the emergency department for cough shortness of breath body aches.  Your chest x-ray did not show an obvious pneumonia.  This is possibly Covid and you were tested and your results will be back in 1 to 2 days.  You should isolate until the results of your testing your symptoms have improved.

## 2018-08-18 NOTE — ED Provider Notes (Signed)
[facility] DEPT Provider Note   CSN: [id number] Arrival date & time: 08/18/18  1749     History   Chief Complaint Chief Complaint  Patient presents with  . Shortness of Breath    HPI Todd Holloway is a 23 y.o. male.  He has history of diabetes.  He works at a nursing home.  He is complaining of a few days of increased shortness of breath and nonproductive cough.  It is been getting progressively worse and he has had a headache.  1 loose bowel movement.  No known fever.  He is continue to work with symptoms.     The history is provided by the patient.  Shortness of Breath Severity:  Moderate Onset quality:  Gradual Timing:  Intermittent Progression:  Worsening Chronicity:  New Context: activity   Relieved by:  Nothing Worsened by:  Activity Ineffective treatments:  Rest Associated symptoms: cough and headaches   Associated symptoms: no abdominal pain, no chest pain, no diaphoresis, no fever, no hemoptysis, no rash, no sore throat, no sputum production, no syncope, no vomiting and no wheezing     Past Medical History:  Diagnosis Date  . Asthma   . Diabetes mellitus without complication [facility])     Patient Active Problem List   Diagnosis Date Noted  . HSV (herpes simplex virus) anogenital infection 06/11/2016    History reviewed. No pertinent surgical history.      Home Medications    Prior to Admission medications   Medication Sig Start Date End Date Taking? Authorizing Provider  albuterol (PROVENTIL HFA;VENTOLIN HFA) 108 (90 Base) MCG/ACT inhaler Inhale 1-2 puffs into the lungs every 6 (six) hours as needed for wheezing or shortness of breath. 03/11/18   Wieters, Hallie C, PA-C  ibuprofen (ADVIL,MOTRIN) 800 MG tablet Take 1 tablet (800 mg total) by mouth 3 (three) times daily. 04/23/18   Raylene Everts, MD    Family History History reviewed. No pertinent family history.  Social History Social History   Tobacco Use  .  Smoking status: Never Smoker  . Smokeless tobacco: Never Used  Substance Use Topics  . Alcohol use: No  . Drug use: No     Allergies   Patient has no known allergies.   Review of Systems Review of Systems  Constitutional: Negative for diaphoresis and fever.  HENT: Negative for sore throat.   Eyes: Negative for visual disturbance.  Respiratory: Positive for cough and shortness of breath. Negative for hemoptysis, sputum production and wheezing.   Cardiovascular: Negative for chest pain and syncope.  Gastrointestinal: Positive for diarrhea (x1). Negative for abdominal pain and vomiting.  Genitourinary: Negative for dysuria.  Musculoskeletal: Positive for myalgias.  Skin: Negative for rash.  Neurological: Positive for headaches.     Physical Exam Updated Vital Signs BP 128/86   Pulse 83   Temp 99.2 F (37.3 C) (Oral)   Resp 16   SpO2 97%   Physical Exam Vitals signs and nursing note reviewed.  Constitutional:      General: He is not in acute distress.    Appearance: He is well-developed. He is not toxic-appearing.  HENT:     Head: Normocephalic and atraumatic.  Eyes:     Conjunctiva/sclera: Conjunctivae normal.  Neck:     Musculoskeletal: Neck supple.  Cardiovascular:     Rate and Rhythm: Normal rate and regular rhythm.     Heart sounds: No murmur.  Pulmonary:     Effort: Pulmonary effort is normal.  No respiratory distress.     Breath sounds: Normal breath sounds.  Abdominal:     Palpations: Abdomen is soft.     Tenderness: There is no abdominal tenderness.  Musculoskeletal: Normal range of motion.     Right lower leg: No edema.     Left lower leg: No edema.  Skin:    General: Skin is warm and dry.     Capillary Refill: Capillary refill takes less than 2 seconds.  Neurological:     General: No focal deficit present.     Mental Status: He is alert and oriented to person, place, and time.      ED Treatments / Results  Labs (all labs ordered are listed,  but only abnormal results are displayed) Labs Reviewed  NOVEL CORONAVIRUS, NAA (HOSPITAL ORDER, SEND-OUT TO REF LAB)    EKG None  Radiology Dg Chest Port 1 View  Result Date: 08/18/2018 CLINICAL DATA:  Cough and shortness of breath. EXAM: PORTABLE CHEST 1 VIEW COMPARISON:  April 04, 2018 FINDINGS: The heart size and mediastinal contours are within normal limits. Both lungs are clear. The visualized skeletal structures are unremarkable. IMPRESSION: No active disease. Electronically Signed   By: Katherine Mantlehristopher  Green M.D.   On: 08/18/2018 20:48    Procedures Procedures (including critical care time)  Medications Ordered in ED Medications - No data to display   Initial Impression / Assessment and Plan / ED Course  I have reviewed the triage vital signs and the nursing notes.  Pertinent labs & imaging results that were available during my care of the patient were reviewed by me and considered in my medical decision making (see chart for details).  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  Differential includes viral syndrome, Covid, pneumonia.  His heart rate blood pressure and sats are good.  Will check a chest x-ray and a send a Covid test and keep isolating at home until results.   [MB]  2056 Patient's chest x-ray does not show any acute findings.  His sats have been okay so I think it is reasonable to send him home and have him isolate.   [MB]    Clinical Course User Index [MB] Terrilee FilesButler,  C, MD   Myrle ShengKaysaun T Michelotti was evaluated in Emergency Department on 08/18/2018 for the symptoms described in the history of present illness. He was evaluated in the context of the global COVID-19 pandemic, which necessitated consideration that the patient might be at risk for infection with the SARS-CoV-2 virus that causes COVID-19. Institutional protocols and algorithms that  pertain to the evaluation of patients at risk for COVID-19 are in a state of rapid change based on information released by regulatory bodies including the CDC and federal and state organizations. These policies and algorithms were followed during the patient's care in the ED.      Final Clinical Impressions(s) / ED Diagnoses   Final diagnoses:  Shortness of breath  Suspected Covid-19 Virus Infection    ED Discharge Orders    None       Terrilee FilesButler,  C, MD 08/18/18 2333

## 2018-08-20 LAB — NOVEL CORONAVIRUS, NAA (HOSP ORDER, SEND-OUT TO REF LAB; TAT 18-24 HRS): SARS-CoV-2, NAA: NOT DETECTED

## 2019-03-30 ENCOUNTER — Other Ambulatory Visit: Payer: Self-pay

## 2019-03-30 ENCOUNTER — Ambulatory Visit (HOSPITAL_COMMUNITY)
Admission: EM | Admit: 2019-03-30 | Discharge: 2019-03-30 | Disposition: A | Discharge status: Home / Self Care | Payer: HRSA Program | Attending: Family Medicine | Admitting: Family Medicine

## 2019-03-30 DIAGNOSIS — U071 COVID-19: Secondary | ICD-10-CM

## 2019-03-30 LAB — POC SARS CORONAVIRUS 2 AG: SARS Coronavirus 2 Ag: POSITIVE — AB

## 2019-03-30 LAB — POC SARS CORONAVIRUS 2 AG -  ED: SARS Coronavirus 2 Ag: POSITIVE — AB

## 2019-03-30 NOTE — ED Triage Notes (Signed)
Pt states had diarrhea onset Saturday night. Pt c/o HA, cough, nausea, back pain to legs, congestion, general body aches, chills, fever of 100.7 onset last night. Denies sore throat.

## 2019-03-30 NOTE — ED Provider Notes (Signed)
[facility]    CSN: [id number] Arrival date & time: 03/30/19  1130      History   Chief Complaint Chief Complaint  Patient presents with  . Fever  . Generalized Body Aches    HPI Todd Holloway is a 24 y.o. male.   Pt is a 24 year old that presents today with 4 days of cough, congestion, fever, body aches, chills,nausea, diarrhea. Symptoms have been constant. He has not been taking anything for the symptoms. 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 No known exposures. No chest pain, SOB.   ROS per HPI      Past Medical History:  Diagnosis Date  . Asthma   . Diabetes mellitus without complication [facility])     Patient Active Problem List   Diagnosis Date Noted  . HSV (herpes simplex virus) anogenital infection 06/11/2016    No past surgical history on file.     Home Medications    Prior to Admission medications   Medication Sig Start Date End Date Taking? Authorizing Provider  albuterol (PROVENTIL HFA;VENTOLIN HFA) 108 (90 Base) MCG/ACT inhaler Inhale 1-2 puffs into the lungs every 6 (six) hours as needed for wheezing or shortness of breath. 03/11/18  Yes Wieters, Hallie C, PA-C  ibuprofen (ADVIL,MOTRIN) 800 MG tablet Take 1 tablet (800 mg total) by mouth 3 (three) times daily. Patient not taking: Reported on 08/18/2018 04/23/18   Raylene Everts, MD    Family History No family history on file.  Social History Social History   Tobacco Use  . Smoking status: Never Smoker  . Smokeless tobacco: Never Used  Substance Use Topics  . Alcohol use: No  . Drug use: No     Allergies   Patient has no known allergies.   Review of Systems Review of Systems   Physical Exam Triage Vital Signs ED Triage Vitals  Enc Vitals Group     BP 03/30/19 1240 124/72     Pulse Rate 03/30/19 1240 92     Resp 03/30/19 1240 20     Temp 03/30/19 1240 100.3 F (37.9 C)     Temp Source 03/30/19 1240 Oral     SpO2 03/30/19 1240 99 %     Weight --      Height --      Head Circumference --      Peak Flow --      Pain Score 03/30/19 1238 6     Pain Loc --      Pain Edu? --      Excl. in [place]? --    No data found.  Updated Vital Signs BP 124/72 (BP Location: Left Arm)   Pulse 92   Temp 100.3 F (37.9 C) (Oral)   Resp 20   SpO2 99%   Visual Acuity Right Eye Distance:   Left Eye Distance:   Bilateral Distance:    Right Eye Near:   Left Eye Near:    Bilateral Near:     Physical Exam Vitals and nursing note reviewed.  Constitutional:      Appearance: Normal appearance.  HENT:     Head: Normocephalic and atraumatic.     Nose: Nose normal.  Eyes:     Conjunctiva/sclera: Conjunctivae normal.  Pulmonary:     Effort: Pulmonary effort is normal.  Musculoskeletal:        General: Normal range of motion.     Cervical back: Normal range of motion.  Skin:    General: Skin  is warm and dry.  Neurological:     Mental Status: He is alert.  Psychiatric:        Mood and Affect: Mood normal.      UC Treatments / Results  Labs (all labs ordered are listed, but only abnormal results are displayed) Labs Reviewed  POC SARS CORONAVIRUS 2 AG -  ED - Abnormal; Notable for the following components:      Result Value   SARS Coronavirus 2 Ag POSITIVE (*)    All other components within normal limits  POC SARS CORONAVIRUS 2 AG - Abnormal; Notable for the following components:   SARS Coronavirus 2 Ag POSITIVE (*)    All other components within normal limits    EKG   Radiology No results found.  Procedures Procedures (including critical care time)  Medications Ordered in UC Medications - No data to display  Initial Impression / Assessment and Plan / UC Course  I have reviewed the triage vital signs and the nursing notes.  Pertinent labs & imaging results that were available during my care of the patient were reviewed by me and considered in my medical decision making (see chart for details).     Covid 19- rapid covid test is  positive Symptomatic treatment quarantine precautions given.  Work note given Return precautions given.  Follow up as needed for continued or worsening symptoms  Final Clinical Impressions(s) / UC Diagnoses   Final diagnoses:  COVID-19     Discharge Instructions     Your Covid test is positive You need to go home and quarantine. You can take over-the-counter medications as needed for your symptoms. Tylenol or ibuprofen for fever and over-the-counter cough medication as needed You may return to work once your quarantine is over which would be 10 days after your symptoms started. If your symptoms worsen you start developing any chest pain or shortness of breath you need to be reevaluated    ED Prescriptions    None     PDMP not reviewed this encounter.   Janace Aris, NP 03/30/19 1343

## 2019-03-30 NOTE — Discharge Instructions (Signed)
Your Covid test is positive You need to go home and quarantine. You can take over-the-counter medications as needed for your symptoms. Tylenol or ibuprofen for fever and over-the-counter cough medication as needed You may return to work once your quarantine is over which would be 10 days after your symptoms started. If your symptoms worsen you start developing any chest pain or shortness of breath you need to be reevaluated

## 2019-03-31 ENCOUNTER — Encounter: Payer: Self-pay | Admitting: Physician Assistant

## 2019-08-09 IMAGING — CT CT ABD-PELV W/ CM
2 of 5 series · 16 of 46 positions shown, 18 images · IV contrast (omnipaque)
Comparison: None.

CLINICAL DATA: Blunt trauma to the abdomen last night with
abdominal pain.

EXAM:
CT ABDOMEN AND PELVIS WITH CONTRAST
TECHNIQUE: Multidetector CT imaging of the abdomen and pelvis was performed
using the standard protocol following bolus administration of
intravenous contrast.
CONTRAST:  100mL OMNIPAQUE IOHEXOL 300 MG/ML  SOLN

[Series 3: abd/ pelvis 5.0 i30f 2 · axial · 0.81mm/px · z∈[+520,+950]mm · 13 of 96 slices shown, 15 images]
[im 5/96  soft-tissue]
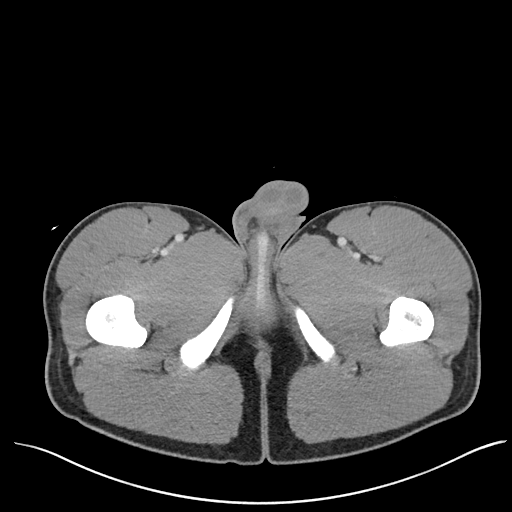
[im 5/96  bone]
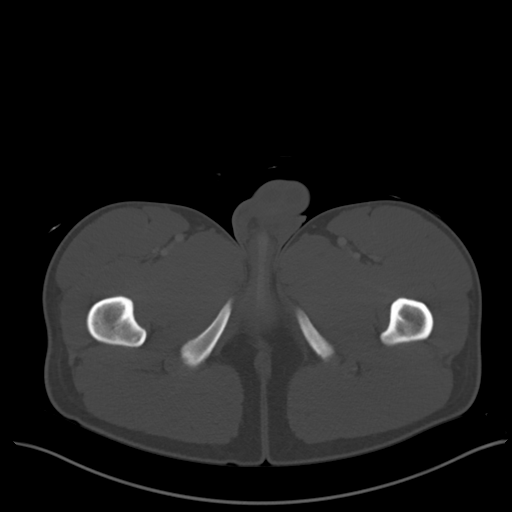
[im 14/96  soft-tissue]
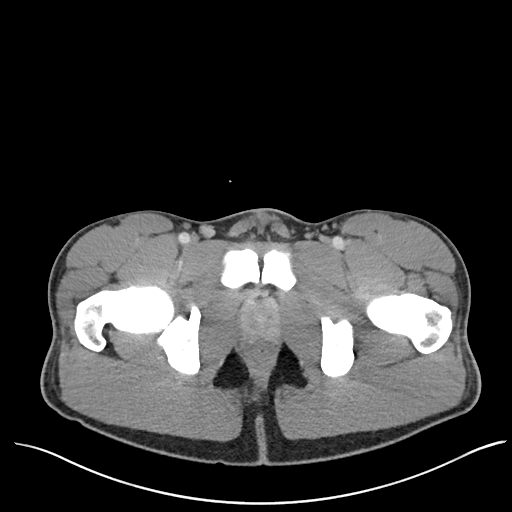
[im 19/96  soft-tissue]
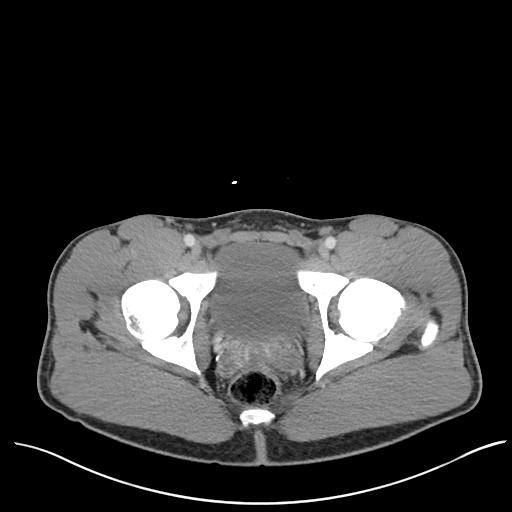
[im 28/96  soft-tissue]
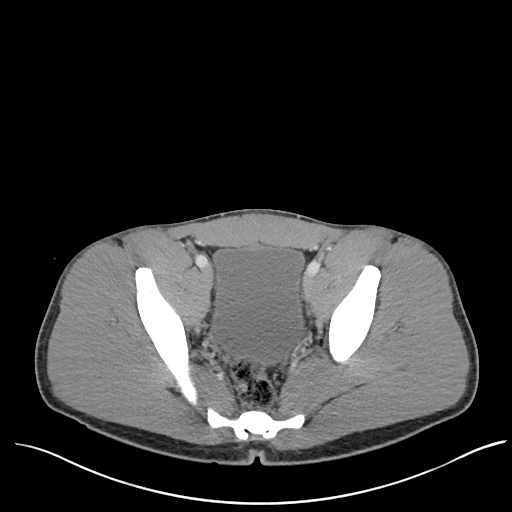
[im 32/96  soft-tissue]
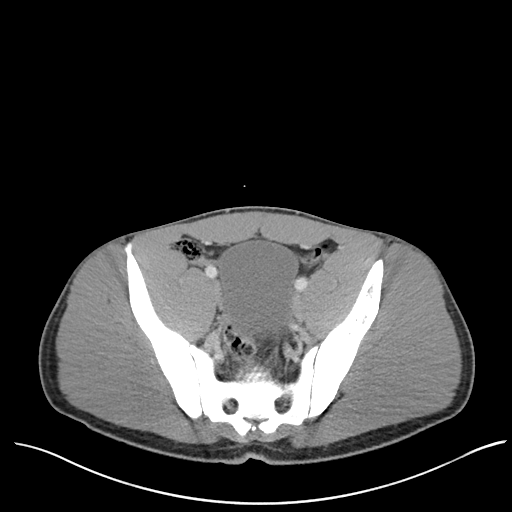
[im 41/96  soft-tissue]
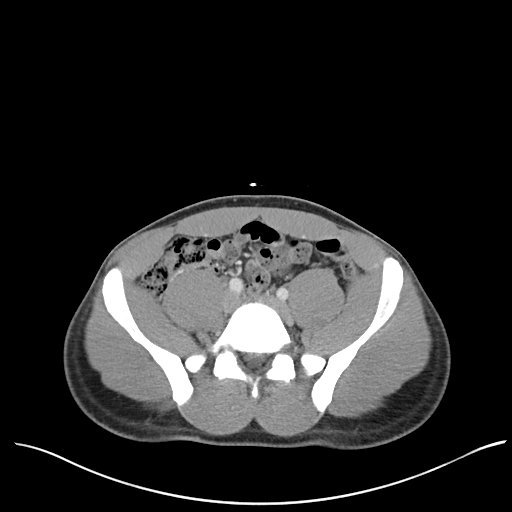
[im 50/96  soft-tissue]
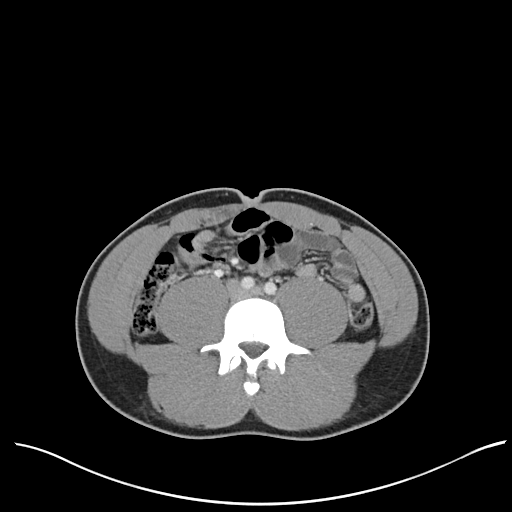
[im 55/96  soft-tissue]
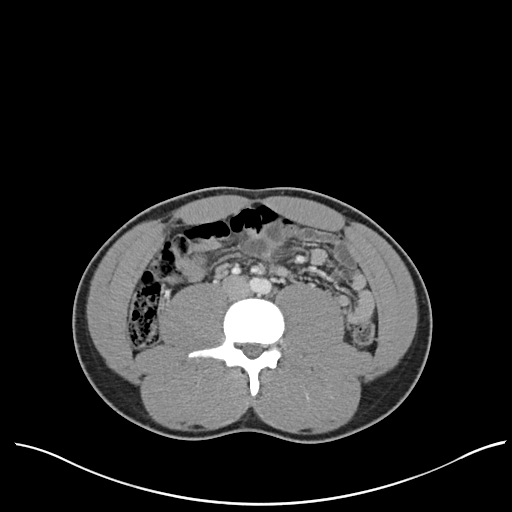
[im 64/96  soft-tissue]
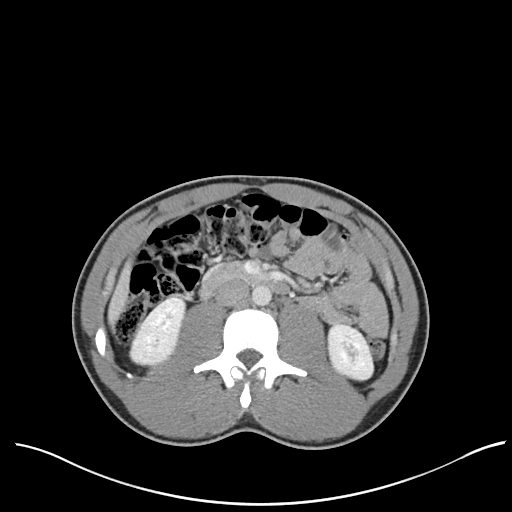
[im 64/96  bone]
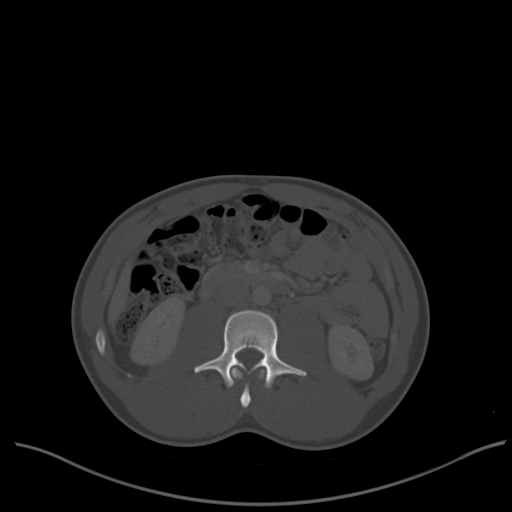
[im 68/96  soft-tissue]
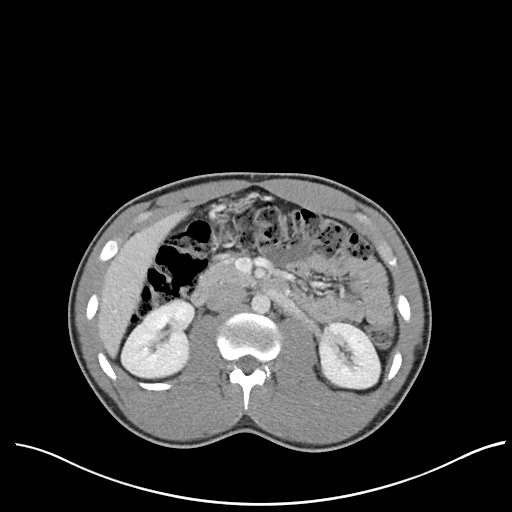
[im 77/96  soft-tissue]
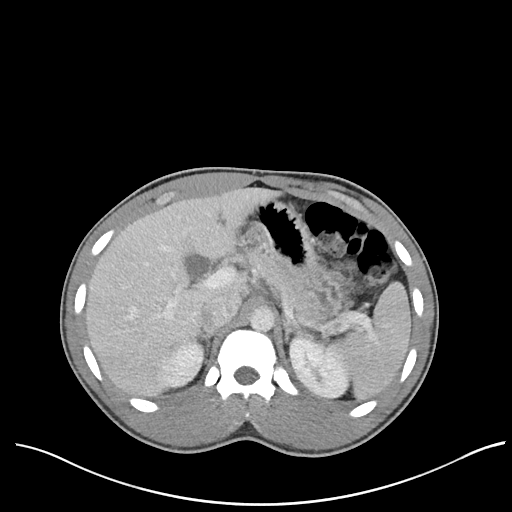
[im 82/96  soft-tissue]
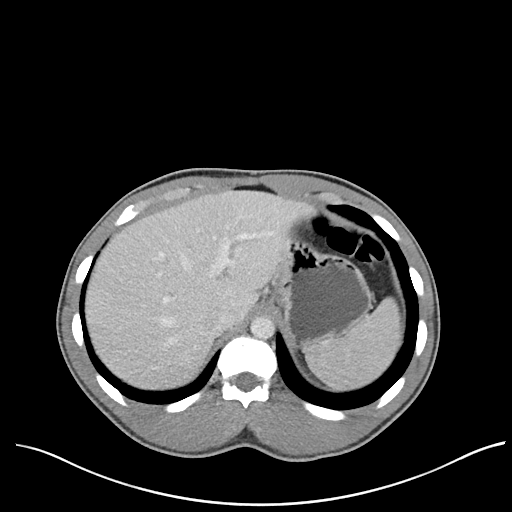
[im 91/96  soft-tissue]
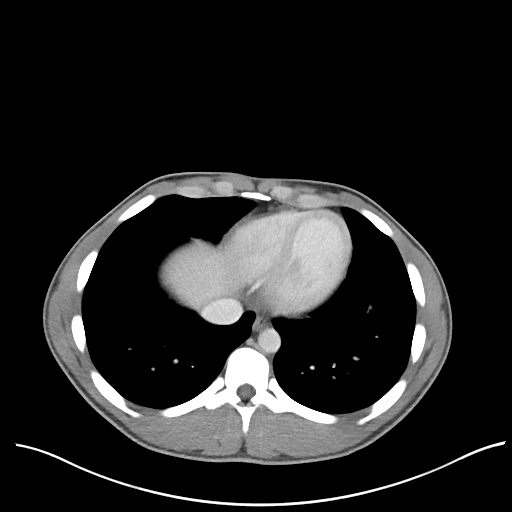

[Series 6: coronal soft tissue · coronal · 0.74mm/px · 3 of 99 slices shown]
[im 33/99  soft-tissue]
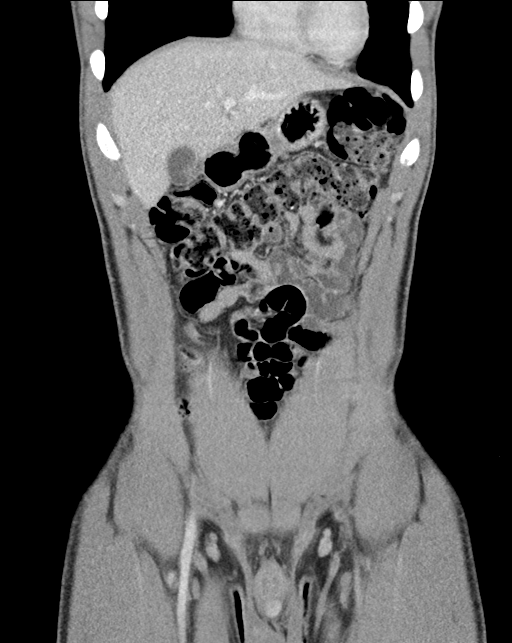
[im 44/99  soft-tissue]
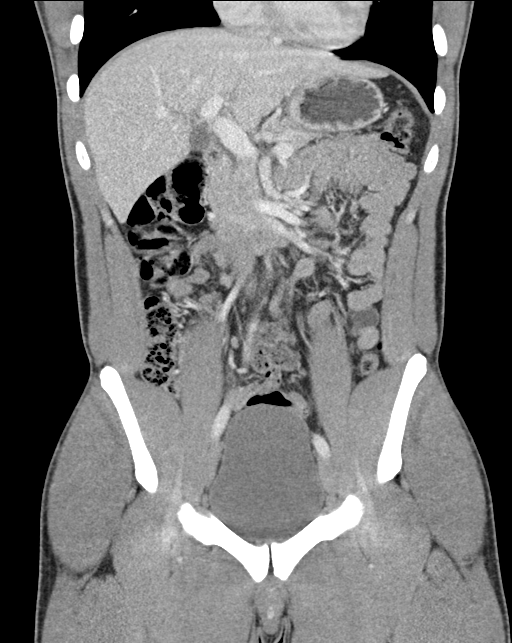
[im 55/99  soft-tissue]
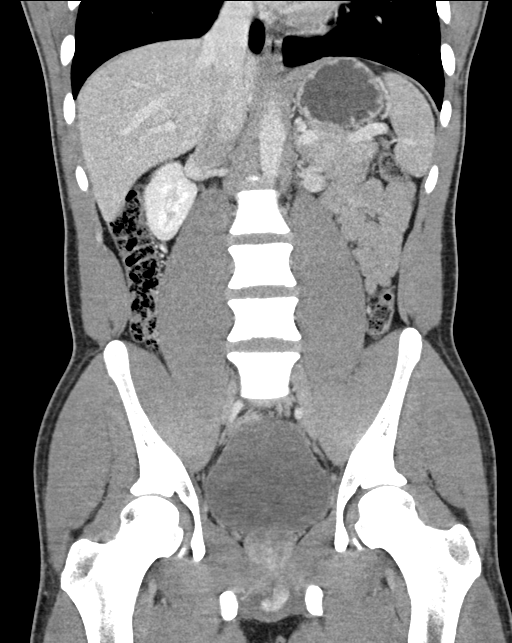

[16 of 46 positions shown; findings below may reference images not displayed]

FINDINGS: Lower chest: No acute abnormality.

Hepatobiliary: Normal liver with no liver laceration or mass. Normal
gallbladder with no radiopaque cholelithiasis. No biliary ductal
dilatation.

Pancreas: Normal, with no laceration, mass or duct dilation.

Spleen: Normal size. No laceration or mass.

Adrenals/Urinary Tract: Normal adrenals. No hydronephrosis. No renal
laceration. No renal mass. Normal bladder.

Stomach/Bowel: Grossly normal stomach. Normal caliber small bowel
with no small bowel wall thickening. Normal appendix. Normal large
bowel with no diverticulosis, large bowel wall thickening or
pericolonic fat stranding.

Vascular/Lymphatic: Normal caliber abdominal aorta with no acute
abdominal aortic injury. Patent portal, splenic, hepatic and renal
veins. No pathologically enlarged lymph nodes in the abdomen or
pelvis.

Reproductive: Normal size prostate.

Other: No pneumoperitoneum, ascites or focal fluid collection.

Musculoskeletal: No aggressive appearing focal osseous lesions. No
fracture in the abdomen or pelvis.
IMPRESSION: No acute traumatic injury in abdomen or pelvis.

## 2019-09-21 ENCOUNTER — Ambulatory Visit: Payer: Self-pay | Attending: Internal Medicine

## 2019-09-21 DIAGNOSIS — Z23 Encounter for immunization: Secondary | ICD-10-CM

## 2019-09-21 NOTE — Progress Notes (Signed)
   Covid-19 Vaccination Clinic  Name:  Todd Holloway    MRN: [id number] DOB: 1995-12-24  09/21/2019  Mr. [name] was observed post Covid-19 immunization for 15 minutes without incident. He was provided with Vaccine Information Sheet and instruction to access the V-Safe system.   Mr. [name] was instructed to call 911 with any severe reactions post vaccine: Marland Kitchen Difficulty breathing  . Swelling of face and throat  . A fast heartbeat  . A bad rash all over body  . Dizziness and weakness   Immunizations Administered    Name Date Dose VIS Date Route   Pfizer COVID-19 Vaccine 09/21/2019  2:09 PM 0.3 mL 04/07/2018 Intramuscular   Manufacturer: [employer], [employer]   Lot: [id number]   NDC: [address]-3

## 2019-10-12 ENCOUNTER — Ambulatory Visit: Payer: Medicaid Other | Attending: Internal Medicine

## 2019-10-12 DIAGNOSIS — Z23 Encounter for immunization: Secondary | ICD-10-CM

## 2019-10-12 NOTE — Progress Notes (Signed)
   Covid-19 Vaccination Clinic  Name:  Todd Holloway    MRN: [id number] DOB: [birth year]  10/12/2019  [name] was observed post Covid-19 immunization for 15 minutes without incident. He was provided with Vaccine Information Sheet and instruction to access the V-Safe system.   [name] was instructed to call 911 with any severe reactions post vaccine: Marland Kitchen Difficulty breathing  . Swelling of face and throat  . A fast heartbeat  . A bad rash all over body  . Dizziness and weakness   Immunizations Administered    Name Date Dose VIS Date Route   Pfizer COVID-19 Vaccine 10/12/2019 10:44 AM 0.3 mL 04/07/2018 Intramuscular   Manufacturer: [employer], [employer]   Lot: [id number]   NDC: [address]-7

## 2019-11-26 ENCOUNTER — Telehealth (HOSPITAL_COMMUNITY): Payer: Self-pay

## 2019-11-26 ENCOUNTER — Ambulatory Visit (HOSPITAL_COMMUNITY)
Admission: EM | Admit: 2019-11-26 | Discharge: 2019-11-26 | Disposition: A | Discharge status: Home / Self Care | Payer: Medicaid Other | Attending: Emergency Medicine | Admitting: Emergency Medicine

## 2019-11-26 ENCOUNTER — Encounter (HOSPITAL_COMMUNITY): Payer: Self-pay | Admitting: Emergency Medicine

## 2019-11-26 ENCOUNTER — Other Ambulatory Visit: Payer: Self-pay

## 2019-11-26 DIAGNOSIS — Z113 Encounter for screening for infections with a predominantly sexual mode of transmission: Secondary | ICD-10-CM

## 2019-11-26 DIAGNOSIS — Z202 Contact with and (suspected) exposure to infections with a predominantly sexual mode of transmission: Secondary | ICD-10-CM | POA: Insufficient documentation

## 2019-11-26 LAB — POCT URINALYSIS DIPSTICK, ED / UC
Bilirubin Urine: NEGATIVE
Glucose, UA: NEGATIVE mg/dL
Hgb urine dipstick: NEGATIVE
Ketones, ur: NEGATIVE mg/dL
Leukocytes,Ua: NEGATIVE
Nitrite: NEGATIVE
Protein, ur: NEGATIVE mg/dL
Specific Gravity, Urine: 1.025 (ref 1.005–1.030)
Urobilinogen, UA: 1 mg/dL (ref 0.0–1.0)
pH: 7 (ref 5.0–8.0)

## 2019-11-26 MED ORDER — CEFTRIAXONE SODIUM 500 MG IJ SOLR
500.0000 mg | Freq: Once | INTRAMUSCULAR | Status: AC
  Administered 2019-11-26: 500 mg via INTRAMUSCULAR

## 2019-11-26 MED ORDER — DOXYCYCLINE HYCLATE 100 MG PO CAPS: 100.0000 mg | ORAL_CAPSULE | Freq: Two times a day (BID) | ORAL | 0 refills | Status: AC

## 2019-11-26 MED ORDER — METRONIDAZOLE 500 MG PO TABS
2000.0000 mg | ORAL_TABLET | Freq: Once | ORAL | Status: AC
  Administered 2019-11-26: 2000 mg via ORAL

## 2019-11-26 MED ORDER — METRONIDAZOLE 500 MG PO TABS
ORAL_TABLET | ORAL | Status: AC
  Filled 2019-11-26: qty 4

## 2019-11-26 MED ORDER — CEFTRIAXONE SODIUM 500 MG IJ SOLR
INTRAMUSCULAR | Status: AC
  Filled 2019-11-26: qty 500

## 2019-11-26 MED ORDER — LIDOCAINE HCL (PF) 1 % IJ SOLN
INTRAMUSCULAR | Status: AC
  Filled 2019-11-26: qty 2

## 2019-11-26 NOTE — ED Provider Notes (Signed)
Emergency Department Provider Note  ____________________________________________  Time seen: Approximately 12:29 PM  I have reviewed the triage vital signs and the nursing notes.   HISTORY  Chief Complaint No chief complaint on file.   Historian Patient     HPI Todd Holloway is a 24 y.o. male presents to the emergency department with suprapubic abdominal discomfort for the past 2 days.  Patient states that his girlfriend recently told him that she tested positive for trichomoniasis.  Patient denies dysuria or increased urinary frequency.  No rash or penile discharge.  No low back pain or fever.  No other alleviating measures have been attempted.   Past Medical History:  Diagnosis Date  . Asthma      Immunizations up to date:  Yes.     Past Medical History:  Diagnosis Date  . Asthma     Patient Active Problem List   Diagnosis Date Noted  . HSV (herpes simplex virus) anogenital infection 06/11/2016    History reviewed. No pertinent surgical history.  Prior to Admission medications   Medication Sig Start Date End Date Taking? Authorizing Provider  albuterol (PROVENTIL HFA;VENTOLIN HFA) 108 (90 Base) MCG/ACT inhaler Inhale 1-2 puffs into the lungs every 6 (six) hours as needed for wheezing or shortness of breath. 03/11/18   Wieters, Hallie C, PA-C  doxycycline (VIBRAMYCIN) 100 MG capsule Take 1 capsule (100 mg total) by mouth 2 (two) times daily for 7 days. 11/26/19 12/03/19  Orvil Feil, PA-C  ibuprofen (ADVIL,MOTRIN) 800 MG tablet Take 1 tablet (800 mg total) by mouth 3 (three) times daily. Patient not taking: Reported on 08/18/2018 04/23/18   Eustace Moore, MD    Allergies Patient has no known allergies.  History reviewed. No pertinent family history.  Social History Social History   Tobacco Use  . Smoking status: Never Smoker  . Smokeless tobacco: Never Used  Substance Use Topics  . Alcohol use: No  . Drug use: No     Review of Systems   Constitutional: No fever/chills Eyes:  No discharge ENT: No upper respiratory complaints. Respiratory: no cough. No SOB/ use of accessory muscles to breath Gastrointestinal:   No nausea, no vomiting.  No diarrhea.  No constipation. Musculoskeletal: Negative for musculoskeletal pain. Skin: Negative for rash, abrasions, lacerations, ecchymosis.    ____________________________________________   PHYSICAL EXAM:  VITAL SIGNS: ED Triage Vitals  Enc Vitals Group     BP 11/26/19 1131 123/84     Pulse Rate 11/26/19 1131 95     Resp 11/26/19 1131 16     Temp 11/26/19 1131 97.8 F (36.6 C)     Temp Source 11/26/19 1131 Oral     SpO2 11/26/19 1131 98 %     Weight 11/26/19 1129 190 lb 0.6 oz (86.2 kg)     Height 11/26/19 1129 6\' 3"  (1.905 m)     Head Circumference --      Peak Flow --      Pain Score 11/26/19 1129 3     Pain Loc --      Pain Edu? --      Excl. in [place]? --      Constitutional: Alert and oriented. Well appearing and in no acute distress. Eyes: Conjunctivae are normal. PERRL. EOMI. Head: Atraumatic. ENT:      Nose: No congestion/rhinnorhea.      Mouth/Throat: Mucous membranes are moist.  Neck: No stridor.  No cervical spine tenderness to palpation. Cardiovascular: Normal rate, regular rhythm. Normal S1  and S2.  Good peripheral circulation. Respiratory: Normal respiratory effort without tachypnea or retractions. Lungs CTAB. Good air entry to the bases with no decreased or absent breath sounds Gastrointestinal: Bowel sounds x 4 quadrants. Soft and nontender to palpation. No guarding or rigidity. No distention. Musculoskeletal: Full range of motion to all extremities. No obvious deformities noted Neurologic:  Normal for age. No gross focal neurologic deficits are appreciated.  Skin:  Skin is warm, dry and intact. No rash noted. Psychiatric: Mood and affect are normal for age. Speech and behavior are normal.   ____________________________________________   LABS (all  labs ordered are listed, but only abnormal results are displayed)  Labs Reviewed  URINE CULTURE  POCT URINALYSIS DIPSTICK, ED / UC  CYTOLOGY, (ORAL, ANAL, URETHRAL) ANCILLARY ONLY   ____________________________________________  EKG   ____________________________________________  RADIOLOGY   No results found.  ____________________________________________    PROCEDURES  Procedure(s) performed:     Procedures     Medications  cefTRIAXone (ROCEPHIN) injection 500 mg (500 mg Intramuscular Given 11/26/19 1215)  metroNIDAZOLE (FLAGYL) tablet 2,000 mg (2,000 mg Oral Given 11/26/19 1214)     ____________________________________________   INITIAL IMPRESSION / ASSESSMENT AND PLAN / ED COURSE  Pertinent labs & imaging results that were available during my care of the patient were reviewed by me and considered in my medical decision making (see chart for details).    Assessment and Plan: STD exposure 24 year old male presents to the emergency department after a potential STD exposure.  Patient's girlfriend recently tested positive for trichomoniasis.  Vital signs are reassuring at triage.  On physical exam, patient was alert, active and nontoxic-appearing.  Urinalysis revealed no signs of cystitis.  Gonorrhea, chlamydia and trichomoniasis testing are in process at this time.  Patient received Rocephin, Flagyl and doxycycline for empiric treatment of gonorrhea, chlamydia and trichomoniasis.  Patient was advised to abstain from unprotected sex for the next 2 weeks and undergo repeat STD testing in 2 to 3 weeks.  All patient questions were answered.   ____________________________________________  FINAL CLINICAL IMPRESSION(S) / ED DIAGNOSES  Final diagnoses:  STD exposure      NEW MEDICATIONS STARTED DURING THIS VISIT:  ED Discharge Orders         Ordered    doxycycline (VIBRAMYCIN) 100 MG capsule  2 times daily        11/26/19 1220              This  chart was dictated using voice recognition software/Dragon. Despite best efforts to proofread, errors can occur which can change the meaning. Any change was purely unintentional.     Orvil Feil, PA-C 11/26/19 1232

## 2019-11-26 NOTE — ED Triage Notes (Signed)
Patient c/o ABD pain x 2 days ago.   Patient stated they had exposure to an STD.   Pain 03/10.   Patient denies any urinary symptoms.

## 2019-11-26 NOTE — Discharge Instructions (Signed)
Take Doxycycline twice daily for the next seven days.  Please abstain from unprotected sex for the next 2 weeks. Have repeat STD testing in 2 weeks.

## 2019-11-27 LAB — URINE CULTURE: Culture: <10000 — AB

## 2019-11-29 LAB — CYTOLOGY, (ORAL, ANAL, URETHRAL) ANCILLARY ONLY
Chlamydia: NEGATIVE
Comment: NEGATIVE
Comment: NEGATIVE
Comment: NORMAL
Neisseria Gonorrhea: NEGATIVE
Trichomonas: POSITIVE — AB

## 2020-05-07 ENCOUNTER — Emergency Department (HOSPITAL_COMMUNITY)
Admission: EM | Admit: 2020-05-07 | Discharge: 2020-05-07 | Disposition: A | Discharge status: Home / Self Care | Payer: Medicaid Other | Attending: Emergency Medicine | Admitting: Emergency Medicine

## 2020-05-07 ENCOUNTER — Encounter (HOSPITAL_COMMUNITY): Payer: Self-pay

## 2020-05-07 DIAGNOSIS — J45909 Unspecified asthma, uncomplicated: Secondary | ICD-10-CM | POA: Insufficient documentation

## 2020-05-07 DIAGNOSIS — F419 Anxiety disorder, unspecified: Secondary | ICD-10-CM | POA: Insufficient documentation

## 2020-05-07 MED ORDER — HYDROXYZINE HCL 25 MG PO TABS: 25.0000 mg | ORAL_TABLET | Freq: Four times a day (QID) | ORAL | 0 refills | Status: AC

## 2020-05-07 MED ORDER — HYDROXYZINE HCL 25 MG PO TABS
25.0000 mg | ORAL_TABLET | Freq: Once | ORAL | Status: AC
  Administered 2020-05-07: 25 mg via ORAL
  Filled 2020-05-07: qty 1
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
## 2020-05-07 NOTE — Discharge Instructions (Signed)
Please follow up with the resources provided.

## 2020-05-07 NOTE — ED Triage Notes (Signed)
Patient complains of anxiety attack and feeling anxious since this am, has hx of asthma but no wheezing. Alert and oriented, and states has been having increased anxiety the past several weeks

## 2020-06-01 ENCOUNTER — Ambulatory Visit (HOSPITAL_COMMUNITY)
Admission: EM | Admit: 2020-06-01 | Discharge: 2020-06-01 | Disposition: A | Discharge status: Home / Self Care | Payer: Medicaid Other | Attending: Urgent Care | Admitting: Urgent Care

## 2020-06-01 ENCOUNTER — Encounter (HOSPITAL_COMMUNITY): Payer: Self-pay | Admitting: Emergency Medicine

## 2020-06-01 ENCOUNTER — Other Ambulatory Visit: Payer: Self-pay

## 2020-06-01 DIAGNOSIS — J101 Influenza due to other identified influenza virus with other respiratory manifestations: Secondary | ICD-10-CM | POA: Insufficient documentation

## 2020-06-01 DIAGNOSIS — R079 Chest pain, unspecified: Secondary | ICD-10-CM | POA: Insufficient documentation

## 2020-06-01 DIAGNOSIS — Z20822 Contact with and (suspected) exposure to covid-19: Secondary | ICD-10-CM | POA: Insufficient documentation

## 2020-06-01 DIAGNOSIS — R0602 Shortness of breath: Secondary | ICD-10-CM | POA: Insufficient documentation

## 2020-06-01 LAB — POC INFLUENZA A AND B ANTIGEN (URGENT CARE ONLY)
INFLUENZA A ANTIGEN, POC: POSITIVE — AB
INFLUENZA B ANTIGEN, POC: NEGATIVE

## 2020-06-01 LAB — SARS CORONAVIRUS 2 (TAT 6-24 HRS): SARS Coronavirus 2: NEGATIVE

## 2020-06-01 LAB — POCT RAPID STREP A, ED / UC: Streptococcus, Group A Screen (Direct): NEGATIVE

## 2020-06-01 MED ORDER — BENZONATATE 100 MG PO CAPS: 100.0000 mg | ORAL_CAPSULE | Freq: Three times a day (TID) | ORAL | 0 refills | Status: DC | PRN

## 2020-06-01 MED ORDER — PROMETHAZINE-DM 6.25-15 MG/5ML PO SYRP: 5.0000 mL | ORAL_SOLUTION | Freq: Every evening | ORAL | 0 refills | Status: DC | PRN

## 2020-06-01 NOTE — Discharge Instructions (Signed)
We will manage this as a viral illness. For sore throat or cough try using a honey-based tea. Use 3 teaspoons of honey with juice squeezed from half lemon. Place shaved pieces of ginger into 1/2-1 cup of water and warm over stove top. Then mix the ingredients and repeat every 4 hours as needed. Please take ibuprofen 600mg every 6 hours with food alternating with OR taken together with Tylenol 500mg-650mg every 6 hours for throat pain, fevers, aches and pains. Hydrate very well with at least 2 liters of water. Eat light meals such as soups (chicken and noodles, vegetable, chicken and wild rice).  Do not eat foods that you are allergic to.  Taking an antihistamine like Zyrtec, Allegra or Claritin can help against postnasal drainage, sinus congestion.  You can take this together with pseudoephedrine (Sudafed) at a dose of 60 mg 3 times a day or twice daily as needed for the same kind of nasal drip, congestion.  However, limit your use of pseudoephedrine if you have high blood pressure or avoid altogether if you have abnormal heart rhythms, heart condition. 

## 2020-06-01 NOTE — ED Provider Notes (Signed)
[facility]   MRN: [id number] DOB: 06-30-1995  Subjective:   Todd Holloway is a 25 y.o. male presenting for 5-day history of acute onset persistent and worsening malaise, fatigue, chills, subjective fever, sore throat, cough, chest pain and shortness of breath.  Patient does have a history of asthma, has been using his albuterol inhaler.  He has had his COVID vaccination but no booster.  No current facility-administered medications for this encounter.  Current Outpatient Medications:  .  ibuprofen (ADVIL,MOTRIN) 800 MG tablet, Take 1 tablet (800 mg total) by mouth 3 (three) times daily., Disp: 21 tablet, Rfl: 0 .  albuterol (PROVENTIL HFA;VENTOLIN HFA) 108 (90 Base) MCG/ACT inhaler, Inhale 1-2 puffs into the lungs every 6 (six) hours as needed for wheezing or shortness of breath., Disp: 1 Inhaler, Rfl: 0   No Known Allergies  Past Medical History:  Diagnosis Date  . Asthma      History reviewed. No pertinent surgical history.  Family History  Problem Relation Age of Onset  . Asthma Father     Social History   Tobacco Use  . Smoking status: Never Smoker  . Smokeless tobacco: Never Used  Vaping Use  . Vaping Use: Never used  Substance Use Topics  . Alcohol use: No  . Drug use: No    ROS   Objective:   Vitals: BP 140/76 (BP Location: Left Arm)   Pulse (!) 106   Temp 99.1 F (37.3 C) (Oral)   Resp 18   SpO2 99%   Physical Exam Constitutional:      General: He is not in acute distress.    Appearance: Normal appearance. He is well-developed. He is not ill-appearing, toxic-appearing or diaphoretic.  HENT:     Head: Normocephalic and atraumatic.     Right Ear: External ear normal.     Left Ear: External ear normal.     Nose: Nose normal.     Mouth/Throat:     Mouth: Mucous membranes are moist.     Pharynx: Oropharynx is clear. No oropharyngeal exudate or posterior oropharyngeal erythema.  Eyes:     General: No scleral icterus.        Right eye: No discharge.        Left eye: No discharge.     Extraocular Movements: Extraocular movements intact.     Conjunctiva/sclera: Conjunctivae normal.     Pupils: Pupils are equal, round, and reactive to light.  Cardiovascular:     Rate and Rhythm: Normal rate and regular rhythm.     Heart sounds: Normal heart sounds. No murmur heard. No friction rub. No gallop.   Pulmonary:     Effort: Pulmonary effort is normal. No respiratory distress.     Breath sounds: Normal breath sounds. No stridor. No wheezing, rhonchi or rales.  Neurological:     Mental Status: He is alert and oriented to person, place, and time.  Psychiatric:        Mood and Affect: Mood normal.        Behavior: Behavior normal.        Thought Content: Thought content normal.        Judgment: Judgment normal.     Results for orders placed or performed during the hospital encounter of 06/01/20 (from the past 24 hour(s))  POCT Rapid Strep A     Status: None   Collection Time: 06/01/20 12:39 PM  Result Value Ref Range   Streptococcus, Group A Screen (Direct) NEGATIVE  NEGATIVE  POC Influenza A & B Ag (Urgent Care)     Status: Abnormal   Collection Time: 06/01/20 12:39 PM  Result Value Ref Range   INFLUENZA A ANTIGEN, POC POSITIVE (A) NEGATIVE   INFLUENZA B ANTIGEN, POC NEGATIVE NEGATIVE    Assessment and Plan :   PDMP not reviewed this encounter.  1. Influenza A     Recommended supportive care for influenza. Labs pending. Counseled patient on potential for adverse effects with medications prescribed/recommended today, ER and return-to-clinic precautions discussed, patient verbalized understanding.    Wallis Bamberg, PA-C 06/01/20 1347

## 2020-06-01 NOTE — ED Triage Notes (Signed)
onset Monday night of feeling bad.  Patient reports intermittent "hot" spells.  Difficulty sleeping, sore throat, cough.  Chest hurts with coughing and sob.  Patient having chills as well

## 2020-06-04 LAB — CULTURE, GROUP A STREP (THRC)

## 2020-06-17 ENCOUNTER — Emergency Department (HOSPITAL_COMMUNITY)
Admission: EM | Admit: 2020-06-17 | Discharge: 2020-06-18 | Disposition: A | Discharge status: Home / Self Care | Payer: Medicaid Other | Attending: Emergency Medicine | Admitting: Emergency Medicine

## 2020-06-17 ENCOUNTER — Emergency Department (HOSPITAL_COMMUNITY): Payer: Medicaid Other

## 2020-06-17 ENCOUNTER — Other Ambulatory Visit: Payer: Self-pay

## 2020-06-17 DIAGNOSIS — Y990 Civilian activity done for income or pay: Secondary | ICD-10-CM | POA: Insufficient documentation

## 2020-06-17 DIAGNOSIS — J45909 Unspecified asthma, uncomplicated: Secondary | ICD-10-CM | POA: Insufficient documentation

## 2020-06-17 DIAGNOSIS — S060X1A Concussion with loss of consciousness of 30 minutes or less, initial encounter: Secondary | ICD-10-CM

## 2020-06-17 DIAGNOSIS — R0789 Other chest pain: Secondary | ICD-10-CM

## 2020-06-17 MED ORDER — METOCLOPRAMIDE HCL 5 MG/ML IJ SOLN
10.0000 mg | INTRAMUSCULAR | Status: AC
  Administered 2020-06-17: 10 mg via INTRAVENOUS
  Filled 2020-06-17: qty 2

## 2020-06-17 MED ORDER — KETOROLAC TROMETHAMINE 15 MG/ML IJ SOLN
15.0000 mg | Freq: Once | INTRAMUSCULAR | Status: AC
  Administered 2020-06-18: 15 mg via INTRAVENOUS
  Filled 2020-06-17: qty 1

## 2020-06-17 MED ORDER — ACETAMINOPHEN 500 MG PO TABS
1000.0000 mg | ORAL_TABLET | Freq: Once | ORAL | Status: AC
  Administered 2020-06-17: 1000 mg via ORAL
  Filled 2020-06-17: qty 2
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
## 2020-06-17 NOTE — Discharge Instructions (Addendum)
You had a normal neurologic exam while in the emergency department.  This is reassuring.  It is likely that you may have a concussion.  A concussion is a diagnosis that is made clinically and does not show any abnormal results on imaging such as a CT scan or MRI.  Your imaging that was performed today is normal.  A concussion may cause a persistent headache over the next few days. Take Tylenol or ibuprofen for this if pain persists. A headache can be brought on or worsened by loud sounds or bright lights. Try to avoid excessive use of cell phones, television, video games as this may worsening headaches.  Avoid strenuous activity and heavy lifting over the next few days.  If you develop severe worsening of your headache, vision changes or loss, uncontrolled vomiting, numbness or tingling to one side of your body, difficulty walking or lifting your arms or legs, return promptly to the emergency department for repeat evaluation.

## 2020-06-17 NOTE — ED Notes (Signed)
Patient removed C-collar on his own, walked out of room to use restroom. States, "I couldn't breathe!," Ambulatory to restroom with a strong and steady gait. C-spine CT read at this time.
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
## 2020-09-06 ENCOUNTER — Encounter (HOSPITAL_COMMUNITY): Payer: Self-pay | Admitting: Emergency Medicine

## 2020-09-06 ENCOUNTER — Ambulatory Visit (HOSPITAL_COMMUNITY)
Admission: EM | Admit: 2020-09-06 | Discharge: 2020-09-06 | Disposition: A | Discharge status: Home / Self Care | Payer: Self-pay | Attending: Medical Oncology | Admitting: Medical Oncology

## 2020-09-06 ENCOUNTER — Other Ambulatory Visit: Payer: Self-pay

## 2020-09-06 DIAGNOSIS — Z113 Encounter for screening for infections with a predominantly sexual mode of transmission: Secondary | ICD-10-CM | POA: Insufficient documentation

## 2020-09-06 DIAGNOSIS — Z202 Contact with and (suspected) exposure to infections with a predominantly sexual mode of transmission: Secondary | ICD-10-CM | POA: Insufficient documentation

## 2020-09-06 MED ORDER — DOXYCYCLINE HYCLATE 100 MG PO CAPS: 100.0000 mg | ORAL_CAPSULE | Freq: Two times a day (BID) | ORAL | 0 refills | Status: DC

## 2020-09-06 NOTE — ED Provider Notes (Signed)
[facility]    CSN: [id number] Arrival date & time: 09/06/20  1034      History   Chief Complaint Chief Complaint  Patient presents with   SEXUALLY TRANSMITTED DISEASE    HPI ALP GOLDWATER is a 25 y.o. male.   HPI  STI testing: Pt presents for STI testing. He reports that his girlfriend tested positive for chlamydia last week.  No penile discharge, testicular pain, abdominal pain, fevers, night sweats or unintentional weight loss.   Past Medical History:  Diagnosis Date   Asthma     Patient Active Problem List   Diagnosis Date Noted   HSV (herpes simplex virus) anogenital infection 06/11/2016    History reviewed. No pertinent surgical history.     Home Medications    Prior to Admission medications   Medication Sig Start Date End Date Taking? Authorizing Provider  albuterol (PROVENTIL HFA;VENTOLIN HFA) 108 (90 Base) MCG/ACT inhaler Inhale 1-2 puffs into the lungs every 6 (six) hours as needed for wheezing or shortness of breath. 03/11/18   Wieters, Hallie C, PA-C  benzonatate (TESSALON) 100 MG capsule Take 1-2 capsules (100-200 mg total) by mouth 3 (three) times daily as needed for cough. Patient not taking: Reported on 09/06/2020 06/01/20   Wallis Bamberg, PA-C  ibuprofen (ADVIL,MOTRIN) 800 MG tablet Take 1 tablet (800 mg total) by mouth 3 (three) times daily. 04/23/18   Eustace Moore, MD  promethazine-dextromethorphan (PROMETHAZINE-DM) 6.25-15 MG/5ML syrup Take 5 mLs by mouth at bedtime as needed for cough. Patient not taking: Reported on 09/06/2020 06/01/20   Wallis Bamberg, PA-C    Family History Family History  Problem Relation Age of Onset   Asthma Father     Social History Social History   Tobacco Use   Smoking status: Never   Smokeless tobacco: Never  Vaping Use   Vaping Use: Never used  Substance Use Topics   Alcohol use: No   Drug use: No     Allergies   Patient has no known allergies.   Review of Systems Review of  Systems  As stated above in HPI Physical Exam Triage Vital Signs ED Triage Vitals  Enc Vitals Group     BP 09/06/20 1142 123/77     Pulse Rate 09/06/20 1142 87     Resp 09/06/20 1142 18     Temp 09/06/20 1142 98.2 F (36.8 C)     Temp Source 09/06/20 1142 Oral     SpO2 09/06/20 1142 100 %     Weight --      Height --      Head Circumference --      Peak Flow --      Pain Score 09/06/20 1140 0     Pain Loc --      Pain Edu? --      Excl. in GC? --    No data found.  Updated Vital Signs BP 123/77 (BP Location: Right Arm)   Pulse 87   Temp 98.2 F (36.8 C) (Oral)   Resp 18   SpO2 100%   Physical Exam Vitals and nursing note reviewed.  Constitutional:      General: He is not in acute distress.    Appearance: Normal appearance. He is not ill-appearing, toxic-appearing or diaphoretic.  Cardiovascular:     Rate and Rhythm: Normal rate and regular rhythm.     Heart sounds: Normal heart sounds.  Pulmonary:     Effort: Pulmonary effort is normal.  Breath sounds: Normal breath sounds.  Genitourinary:    Comments: Pt collects self swab Neurological:     Mental Status: He is alert.     UC Treatments / Results  Labs (all labs ordered are listed, but only abnormal results are displayed) Labs Reviewed  CYTOLOGY, (ORAL, ANAL, URETHRAL) ANCILLARY ONLY    EKG   Radiology No results found.  Procedures Procedures (including critical care time)  Medications Ordered in UC Medications - No data to display  Initial Impression / Assessment and Plan / UC Course  I have reviewed the triage vital signs and the nursing notes.  Pertinent labs & imaging results that were available during my care of the patient were reviewed by me and considered in my medical decision making (see chart for details).     New.  Treating with doxycycline for chlamydia to prevent any systemic complications.  Discussed how to use along with common potential side effects and precautions.   Follow-up as needed.  Safe sex handout given to patient. Final Clinical Impressions(s) / UC Diagnoses   Final diagnoses:  None   Discharge Instructions   None    ED Prescriptions   None    PDMP not reviewed this encounter.   Rushie Chestnut, [place] 09/06/20 1158

## 2020-09-06 NOTE — ED Triage Notes (Signed)
Patient here for std evaluation.  Patient thinks he may have been exposed to chlamydia.  Patient denies symptoms

## 2020-09-08 LAB — CYTOLOGY, (ORAL, ANAL, URETHRAL) ANCILLARY ONLY
Chlamydia: NEGATIVE
Comment: NEGATIVE
Comment: NEGATIVE
Comment: NORMAL
Neisseria Gonorrhea: NEGATIVE
Trichomonas: NEGATIVE

## 2020-11-21 ENCOUNTER — Encounter (HOSPITAL_COMMUNITY): Payer: Self-pay

## 2020-11-21 ENCOUNTER — Ambulatory Visit (HOSPITAL_COMMUNITY)
Admission: EM | Admit: 2020-11-21 | Discharge: 2020-11-21 | Disposition: A | Discharge status: Home / Self Care | Payer: Self-pay | Attending: Student | Admitting: Student

## 2020-11-21 ENCOUNTER — Other Ambulatory Visit: Payer: Self-pay

## 2020-11-21 DIAGNOSIS — Z113 Encounter for screening for infections with a predominantly sexual mode of transmission: Secondary | ICD-10-CM | POA: Insufficient documentation

## 2020-11-21 DIAGNOSIS — N4829 Other inflammatory disorders of penis: Secondary | ICD-10-CM

## 2020-11-21 DIAGNOSIS — R110 Nausea: Secondary | ICD-10-CM

## 2020-11-21 LAB — POCT URINALYSIS DIPSTICK, ED / UC
Bilirubin Urine: NEGATIVE
Glucose, UA: NEGATIVE mg/dL
Hgb urine dipstick: NEGATIVE
Ketones, ur: NEGATIVE mg/dL
Leukocytes,Ua: NEGATIVE
Nitrite: NEGATIVE
Protein, ur: NEGATIVE mg/dL
Specific Gravity, Urine: 1.025 (ref 1.005–1.030)
Urobilinogen, UA: 1 mg/dL (ref 0.0–1.0)
pH: 7 (ref 5.0–8.0)

## 2020-11-21 LAB — POC INFLUENZA A AND B ANTIGEN (URGENT CARE ONLY)
INFLUENZA A ANTIGEN, POC: NEGATIVE
INFLUENZA B ANTIGEN, POC: NEGATIVE

## 2020-11-21 MED ORDER — ONDANSETRON 8 MG PO TBDP: 8.0000 mg | ORAL_TABLET | Freq: Three times a day (TID) | ORAL | 0 refills | Status: DC | PRN

## 2020-11-21 MED ORDER — DOXYCYCLINE HYCLATE 100 MG PO CAPS: 100.0000 mg | ORAL_CAPSULE | Freq: Two times a day (BID) | ORAL | 0 refills | Status: AC

## 2020-11-21 NOTE — Discharge Instructions (Addendum)
-  Your flu test was negative today. -Take the Zofran (ondansetron) up to 3 times daily for nausea and vomiting. Dissolve one pill under your tongue or between your teeth and your cheek. -Doxycycline twice daily for 7 days.  Make sure to wear sunscreen while spending time outside while on this medication as it can increase your chance of sunburn. You can take this medication with food if you have a sensitive stomach. -Follow-up if symptoms worsen/persist.
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
## 2020-11-21 NOTE — ED Notes (Signed)
No answer in lobby.
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
## 2020-11-22 ENCOUNTER — Ambulatory Visit (HOSPITAL_COMMUNITY)
Admission: EM | Admit: 2020-11-22 | Discharge: 2020-11-22 | Disposition: A | Discharge status: Home / Self Care | Payer: Self-pay | Attending: Family Medicine | Admitting: Family Medicine

## 2020-11-22 ENCOUNTER — Other Ambulatory Visit: Payer: Self-pay

## 2020-11-22 ENCOUNTER — Telehealth (HOSPITAL_COMMUNITY): Payer: Self-pay | Admitting: Emergency Medicine

## 2020-11-22 DIAGNOSIS — A549 Gonococcal infection, unspecified: Secondary | ICD-10-CM

## 2020-11-22 LAB — CYTOLOGY, (ORAL, ANAL, URETHRAL) ANCILLARY ONLY
Chlamydia: NEGATIVE
Comment: NEGATIVE
Comment: NEGATIVE
Comment: NORMAL
Neisseria Gonorrhea: POSITIVE — AB
Trichomonas: NEGATIVE

## 2020-11-22 MED ORDER — CEFTRIAXONE SODIUM 500 MG IJ SOLR
INTRAMUSCULAR | Status: AC
  Filled 2020-11-22: qty 500

## 2020-11-22 MED ORDER — CEFTRIAXONE SODIUM 500 MG IJ SOLR
500.0000 mg | Freq: Once | INTRAMUSCULAR | Status: AC
  Administered 2020-11-22: 500 mg via INTRAMUSCULAR

## 2020-11-22 NOTE — ED Notes (Signed)
Verified with dr hagler, patient does NOT need doxycycline.  Patient states understanding

## 2020-11-22 NOTE — Telephone Encounter (Signed)
Per protocol, patient will need treatment with IM Rocephin 500mg.   °Contacted patient by phone.  Verified identity using two identifiers.  Provided positive result.  Reviewed safe sex practices, notifying partners, and refraining from sexual activities for 7 days from time of treatment.  Patient verified understanding, all questions answered.   °[facility] notified °

## 2020-11-22 NOTE — ED Triage Notes (Signed)
Patient is in department for std treatment

## 2020-11-22 NOTE — Discharge Instructions (Signed)
Practice safe sex

## 2020-12-01 ENCOUNTER — Ambulatory Visit (HOSPITAL_COMMUNITY)
Admission: EM | Admit: 2020-12-01 | Discharge: 2020-12-01 | Disposition: A | Discharge status: Home / Self Care | Payer: Self-pay | Attending: Emergency Medicine | Admitting: Emergency Medicine

## 2020-12-01 ENCOUNTER — Other Ambulatory Visit: Payer: Self-pay

## 2020-12-01 ENCOUNTER — Encounter (HOSPITAL_COMMUNITY): Payer: Self-pay

## 2020-12-01 DIAGNOSIS — Z113 Encounter for screening for infections with a predominantly sexual mode of transmission: Secondary | ICD-10-CM | POA: Insufficient documentation

## 2020-12-01 DIAGNOSIS — Z202 Contact with and (suspected) exposure to infections with a predominantly sexual mode of transmission: Secondary | ICD-10-CM | POA: Insufficient documentation

## 2020-12-01 MED ORDER — PENICILLIN G BENZATHINE 1200000 UNIT/2ML IM SUSY: 2.4000 10*6.[IU] | PREFILLED_SYRINGE | Freq: Once | INTRAMUSCULAR | Status: DC

## 2020-12-01 NOTE — ED Triage Notes (Signed)
Pt presents for STD testing. States he has been exposed to a partner with confirmed case of Syphilis.

## 2020-12-01 NOTE — Discharge Instructions (Addendum)
Today you are being treated prophylactically for syphilis due to exposure, please do not have sex for 7 days  Labs pending 2-3 days, you will be contacted if positive for any sti and treatment will be sent to the pharmacy, you will have to return to the clinic if positive for gonorrhea to receive treatment   Please refrain from having sex until labs results, if positive please refrain from having sex until treatment complete and symptoms resolve   If positive for Syphilis, Chlamydia  gonorrhea or trichomoniasis please notify partner or partners so they may tested as well  Moving forward, it is recommended you use some form of protection against the transmission of sti infections  such as condoms or dental dams with each sexual encounter

## 2020-12-01 NOTE — ED Provider Notes (Signed)
[facility]    CSN: [id number] Arrival date & time: 12/01/20  1031      History   Chief Complaint Chief Complaint  Patient presents with   SEXUALLY TRANSMITTED DISEASE    HPI Todd Holloway is a 25 y.o. male.   Patient presents requesting STD testing.  Denies all symptoms.  Completed cytology testing on 11/21/2020 positive for gonorrhea and has received treatment.  Denies sexual activity since last test results but endorses that partner tested positive for syphilis.  Sexually active, no condom use.  History of HSV  Past Medical History:  Diagnosis Date   Asthma     Patient Active Problem List   Diagnosis Date Noted   HSV (herpes simplex virus) anogenital infection 06/11/2016    History reviewed. No pertinent surgical history.     Home Medications    Prior to Admission medications   Medication Sig Start Date End Date Taking? Authorizing Provider  albuterol (PROVENTIL HFA;VENTOLIN HFA) 108 (90 Base) MCG/ACT inhaler Inhale 1-2 puffs into the lungs every 6 (six) hours as needed for wheezing or shortness of breath. 03/11/18   Wieters, Hallie C, PA-C  benzonatate (TESSALON) 100 MG capsule Take 1-2 capsules (100-200 mg total) by mouth 3 (three) times daily as needed for cough. Patient not taking: Reported on 09/06/2020 06/01/20   Wallis Bamberg, PA-C  ibuprofen (ADVIL,MOTRIN) 800 MG tablet Take 1 tablet (800 mg total) by mouth 3 (three) times daily. 04/23/18   Eustace Moore, MD  ondansetron (ZOFRAN ODT) 8 MG disintegrating tablet Take 1 tablet (8 mg total) by mouth every 8 (eight) hours as needed for nausea or vomiting. 11/21/20   Rhys Martini, PA-C  promethazine-dextromethorphan (PROMETHAZINE-DM) 6.25-15 MG/5ML syrup Take 5 mLs by mouth at bedtime as needed for cough. Patient not taking: Reported on 09/06/2020 06/01/20   Wallis Bamberg, PA-C    Family History Family History  Problem Relation Age of Onset   Asthma Father     Social History Social History    Tobacco Use   Smoking status: Never   Smokeless tobacco: Never  Vaping Use   Vaping Use: Never used  Substance Use Topics   Alcohol use: No   Drug use: No     Allergies   Patient has no known allergies.   Review of Systems Review of Systems  Respiratory: Negative.    Cardiovascular: Negative.   Gastrointestinal: Negative.   Genitourinary: Negative.   Skin: Negative.   Neurological: Negative.     Physical Exam Triage Vital Signs ED Triage Vitals  Enc Vitals Group     BP 12/01/20 1130 133/77     Pulse Rate 12/01/20 1130 76     Resp 12/01/20 1130 18     Temp 12/01/20 1130 98.1 F (36.7 C)     Temp Source 12/01/20 1130 Oral     SpO2 12/01/20 1130 98 %     Weight --      Height --      Head Circumference --      Peak Flow --      Pain Score 12/01/20 1131 0     Pain Loc --      Pain Edu? --      Excl. in GC? --    No data found.  Updated Vital Signs BP 133/77 (BP Location: Right Arm)   Pulse 76   Temp 98.1 F (36.7 C) (Oral)   Resp 18   SpO2 98%   Visual Acuity Right  Eye Distance:   Left Eye Distance:   Bilateral Distance:    Right Eye Near:   Left Eye Near:    Bilateral Near:     Physical Exam Constitutional:      Appearance: Normal appearance. He is normal weight.  HENT:     Head: Normocephalic.  Eyes:     Extraocular Movements: Extraocular movements intact.  Pulmonary:     Effort: Pulmonary effort is normal.  Genitourinary:    Comments: Deferred self collect urethral swab Skin:    General: Skin is warm and dry.  Neurological:     Mental Status: He is alert and oriented to person, place, and time. Mental status is at baseline.  Psychiatric:        Mood and Affect: Mood normal.        Behavior: Behavior normal.     UC Treatments / Results  Labs (all labs ordered are listed, but only abnormal results are displayed) Labs Reviewed  RPR  CYTOLOGY, (ORAL, ANAL, URETHRAL) ANCILLARY ONLY    EKG   Radiology No results  found.  Procedures Procedures (including critical care time)  Medications Ordered in UC Medications  penicillin g benzathine (BICILLIN LA) 1200000 UNIT/2ML injection 2.4 Million Units (has no administration in time range)    Initial Impression / Assessment and Plan / UC Course  I have reviewed the triage vital signs and the nursing notes.  Pertinent labs & imaging results that were available during my care of the patient were reviewed by me and considered in my medical decision making (see chart for details).  Routine screening for STI Exposure to syphilis  1.  Declined HIV testing 2.  We will treat prophylactically for syphilis while awaiting results, penicillin G benzathine 2,400,000 units IM now 3.  Urethral cytology swab pending 4.  Advised patient abstinence for 7 days, advised condom use moving forward Final Clinical Impressions(s) / UC Diagnoses   Final diagnoses:  Routine screening for STI (sexually transmitted infection)  Exposure to syphilis     Discharge Instructions      Today you are being treated prophylactically for syphilis due to exposure, please do not have sex for 7 days  Labs pending 2-3 days, you will be contacted if positive for any sti and treatment will be sent to the pharmacy, you will have to return to the clinic if positive for gonorrhea to receive treatment   Please refrain from having sex until labs results, if positive please refrain from having sex until treatment complete and symptoms resolve   If positive for Syphilis, Chlamydia  gonorrhea or trichomoniasis please notify partner or partners so they may tested as well  Moving forward, it is recommended you use some form of protection against the transmission of sti infections  such as condoms or dental dams with each sexual encounter     ED Prescriptions   None    PDMP not reviewed this encounter.   Valinda Hoar, NP 12/01/20 1145

## 2020-12-02 LAB — RPR: RPR Ser Ql: NONREACTIVE

## 2020-12-04 LAB — CYTOLOGY, (ORAL, ANAL, URETHRAL) ANCILLARY ONLY
Chlamydia: NEGATIVE
Comment: NEGATIVE
Comment: NEGATIVE
Comment: NORMAL
Neisseria Gonorrhea: NEGATIVE
Trichomonas: NEGATIVE

## 2021-01-06 ENCOUNTER — Encounter (HOSPITAL_COMMUNITY): Payer: Self-pay | Admitting: Emergency Medicine

## 2021-01-06 ENCOUNTER — Ambulatory Visit (HOSPITAL_COMMUNITY)
Admission: EM | Admit: 2021-01-06 | Discharge: 2021-01-06 | Disposition: A | Discharge status: Home / Self Care | Payer: Medicaid Other | Attending: Emergency Medicine | Admitting: Emergency Medicine

## 2021-01-06 ENCOUNTER — Other Ambulatory Visit: Payer: Self-pay

## 2021-01-06 DIAGNOSIS — M67432 Ganglion, left wrist: Secondary | ICD-10-CM

## 2021-01-06 DIAGNOSIS — R300 Dysuria: Secondary | ICD-10-CM | POA: Insufficient documentation

## 2021-01-06 DIAGNOSIS — N3001 Acute cystitis with hematuria: Secondary | ICD-10-CM | POA: Insufficient documentation

## 2021-01-06 DIAGNOSIS — Z202 Contact with and (suspected) exposure to infections with a predominantly sexual mode of transmission: Secondary | ICD-10-CM

## 2021-01-06 DIAGNOSIS — Z113 Encounter for screening for infections with a predominantly sexual mode of transmission: Secondary | ICD-10-CM | POA: Insufficient documentation

## 2021-01-06 LAB — POCT URINALYSIS DIPSTICK, ED / UC
Glucose, UA: NEGATIVE mg/dL
Ketones, ur: 15 mg/dL — AB
Leukocytes,Ua: NEGATIVE
Nitrite: NEGATIVE
Protein, ur: 100 mg/dL — AB
Specific Gravity, Urine: >=1.03 (ref 1.005–1.030)
Urobilinogen, UA: 1 mg/dL (ref 0.0–1.0)
pH: 6.5 (ref 5.0–8.0)

## 2021-01-06 MED ORDER — IBUPROFEN 800 MG PO TABS: 800.0000 mg | ORAL_TABLET | Freq: Three times a day (TID) | ORAL | 0 refills | Status: AC

## 2021-01-06 MED ORDER — SULFAMETHOXAZOLE-TRIMETHOPRIM 800-160 MG PO TABS: 1.0000 | ORAL_TABLET | Freq: Two times a day (BID) | ORAL | 0 refills | Status: AC

## 2021-01-06 MED ORDER — IBUPROFEN 800 MG PO TABS: 800.0000 mg | ORAL_TABLET | Freq: Three times a day (TID) | ORAL | 0 refills | Status: DC

## 2021-01-06 NOTE — ED Triage Notes (Signed)
Pt presents with exposure to STD with dysuria and lower abdominal discomfort Xs 4 days.   Pt also c/o cyst on left wrist. States severe pain at times.

## 2021-01-06 NOTE — ED Provider Notes (Signed)
[place]    CSN: [id number] Arrival date & time: 01/06/21  1129    HISTORY   Chief Complaint  Patient presents with   Hand Pain   Exposure to STD   Dysuria   HPI Todd Holloway is a 25 y.o. male. Patient presents to urgent care today complaining of finding out last night that he has been exposed to herpes.  Patient states he is no longer in this relationship.  History of complains of some mild penile itching and some pain in his lower abdomen for the past 4 days.  Patient also complains of a cyst on the backside of his left wrist.  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  Patient states he noticed the cyst about a month ago, states does not have any pain when he touches it but when he is having to use his hands to lift push and pull, this causes pain in his left wrist.  States he is not tried any treatment for this this, has not been evaluated for this in the past, has never had a feeling this before.  Patient denies penile discharge, genital lesions.  The history is provided by the patient.  Past Medical History:  Diagnosis Date   Asthma    Patient Active Problem List   Diagnosis Date Noted   HSV (herpes simplex virus) anogenital infection 06/11/2016   History reviewed. No pertinent surgical history.  Home Medications    Prior to Admission medications   Medication Sig Start Date End Date Taking? Authorizing Provider  sulfamethoxazole-trimethoprim (BACTRIM DS) 800-160 MG tablet Take 1 tablet by mouth 2 (two) times daily for 7 days. 01/06/21 01/13/21 Yes Lynden Oxford Scales, PA-C  albuterol (PROVENTIL HFA;VENTOLIN HFA) 108 (90 Base) MCG/ACT inhaler Inhale 1-2 puffs into the lungs every 6 (six) hours as needed for wheezing or shortness of breath. 03/11/18   Wieters, Hallie C, PA-C  ibuprofen (ADVIL) 800 MG tablet Take 1 tablet (800 mg total) by mouth 3 (three) times daily for 14 days. 01/06/21  01/20/21  Lynden Oxford Scales, PA-C  ondansetron (ZOFRAN ODT) 8 MG disintegrating tablet Take 1 tablet (8 mg total) by mouth every 8 (eight) hours as needed for nausea or vomiting. 11/21/20   Hazel Sams, PA-C   Family History Family History  Problem Relation Age of Onset   Asthma Father    Social History Social History   Tobacco Use   Smoking status: Never   Smokeless tobacco: Never  Vaping Use   Vaping Use: Never used  Substance Use Topics   Alcohol use: No   Drug use: No   Allergies   Patient has no known allergies.  Review of Systems Review of Systems Pertinent findings noted in history of present illness.   Physical Exam Triage Vital Signs ED Triage Vitals  Enc Vitals Group     BP 12/08/20 0827 (!) 147/82     Pulse Rate 12/08/20 0827 72     Resp 12/08/20 0827 18     Temp 12/08/20 0827 98.3 F (36.8 C)     Temp Source 12/08/20 0827 Oral     SpO2 12/08/20 0827 98 %     Weight --      Height --      Head Circumference --      Peak Flow --      Pain Score 12/08/20 0826 5     Pain Loc --  Pain Edu? --      Excl. in [place]? --   No data found.  Updated Vital Signs BP 120/73 (BP Location: Left Arm)   Pulse (!) 102   Temp 99 F (37.2 C) (Oral)   Resp 16   SpO2 96%   Physical Exam Vitals and nursing note reviewed.  Constitutional:      General: He is not in acute distress.    Appearance: Normal appearance. He is not ill-appearing.  HENT:     Head: Normocephalic and atraumatic.  Eyes:     General: Lids are normal.        Right eye: No discharge.        Left eye: No discharge.     Extraocular Movements: Extraocular movements intact.     Conjunctiva/sclera: Conjunctivae normal.     Right eye: Right conjunctiva is not injected.     Left eye: Left conjunctiva is not injected.  Neck:     Trachea: Trachea and phonation normal.  Cardiovascular:     Rate and Rhythm: Regular rhythm.     Pulses: Normal pulses.     Heart sounds: Normal heart sounds.  No murmur heard.   No friction rub. No gallop.  Pulmonary:     Effort: Pulmonary effort is normal. No accessory muscle usage, prolonged expiration or respiratory distress.     Breath sounds: Normal breath sounds. No stridor, decreased air movement or transmitted upper airway sounds. No decreased breath sounds, wheezing, rhonchi or rales.  Chest:     Chest wall: No tenderness.  Genitourinary:    Comments: Pt politely declines GU exam, pt did provide a swab for testing.   Musculoskeletal:        General: Normal range of motion.     Cervical back: Normal range of motion and neck supple. Normal range of motion.     Comments: 2 x 3 cm ganglion cyst on left wrist not immediately tender to palpation, there is no erythema present, cyst is fluid-filled and mobile.  Lymphadenopathy:     Cervical: No cervical adenopathy.  Skin:    General: Skin is warm and dry.     Findings: No erythema or rash.  Neurological:     General: No focal deficit present.     Mental Status: He is alert and oriented to person, place, and time.  Psychiatric:        Mood and Affect: Mood normal.        Behavior: Behavior normal.    Visual Acuity Right Eye Distance:   Left Eye Distance:   Bilateral Distance:    Right Eye Near:   Left Eye Near:    Bilateral Near:     UC Couse / Diagnostics / Procedures:    EKG  Radiology No results found.  Procedures Procedures (including critical care time)  UC Diagnoses / Final Clinical Impressions(s)    Final diagnoses:  Ganglion cyst of dorsum of left wrist  Dysuria  Acute cystitis with hematuria  Exposure to genital herpes  Screening examination for STD (sexually transmitted disease)   I have reviewed the triage vital signs and the nursing notes.  Pertinent labs & imaging results that were available during my care of the patient were reviewed by me and considered in my medical decision making (see chart for details).    Patient presents today with concerns  for exposure to sexually transmitted disease, requesting testing.  STD screening was performed as indicated.  Patient has been advised that the  results of screening will be made available to them via MyChart and, if there are any positive findings, they will be contacted by phone, recommendations for treatment will be advised and prescriptions will be provided as indicated based on clinical guidelines.  Patient has also been advised that if treatment is recommended, they should abstain from sexual intercourse of all forms until treatment is complete.  Patient has further been advised that once treatment is complete, they have not had a complete resolution of their symptoms, if any, they should continue to abstain from sexual intercourse with all forms and follow-up with her primary care provider or return to urgent care for repeat testing.  Patient advised that I recommend he follow-up with orthopedics regarding evaluation and treatment of ganglion cyst on his left wrist.  Patient provided with the names of 2 orthopedics in the area and a prescription for ibuprofen for his pain.  Urine dip today was concerning for possible urinary tract infection.  Patient will be treated empirically for presumed acute cystitis while we await the results of urine culture.  Patient/parent/caregiver verbalized understanding and agreement of plan as discussed.  All questions were addressed during visit.  Please see discharge instructions below for further details of plan.  ED Prescriptions     Medication Sig Dispense Auth. Provider   sulfamethoxazole-trimethoprim (BACTRIM DS) 800-160 MG tablet Take 1 tablet by mouth 2 (two) times daily for 7 days. 14 tablet Theadora Rama Scales, PA-C   ibuprofen (ADVIL) 800 MG tablet  (Status: Discontinued) Take 1 tablet (800 mg total) by mouth 3 (three) times daily for 14 days. 42 tablet Theadora Rama Scales, PA-C   ibuprofen (ADVIL) 800 MG tablet Take 1 tablet (800 mg total) by mouth  3 (three) times daily for 14 days. 42 tablet Theadora Rama Scales, PA-C      PDMP not reviewed this encounter.  Pending results:  Labs Reviewed  POCT URINALYSIS DIPSTICK, ED / UC - Abnormal; Notable for the following components:      Result Value   Bilirubin Urine SMALL (*)    Ketones, ur 15 (*)    Hgb urine dipstick TRACE (*)    Protein, ur 100 (*)    All other components within normal limits  URINE CULTURE  CYTOLOGY, (ORAL, ANAL, URETHRAL) ANCILLARY ONLY     Medications Ordered in UC: Medications - No data to display  Discharge Instructions:   Discharge Instructions      Your urinalysis today was concerning for possible bacterial infection in your urinary tract, I recommend that you begin taking Bactrim, 1 tablet twice daily for the next 7 days.  Your urine sample will be sent to laboratory for culture to hopefully identify the organism that is causing your infection, you will be notified of that result once it is complete which typically takes 3 to 5 days.  If any changes to your antibiotic regimen needs to be made, you will be advised and the prescriptions will be provided for you.  The results of your STD testing today will be made available to you once received.  They will be posted to your MyChart and, if any of your results are abnormal, you will receive a phone call with those results along with further instructions regarding treatment.  Currently the guidelines do not recommend postexposure treatment for exposure to herpes.  My best recommendation is that you continue to monitor yourself for any new lesions that are exquisitely tender, violently red or have a blisterlike appearance.  If  any of these occur, please seek immediate evaluation and request testing for genital herpes.  I provided you with some information about ganglion cysts, causes and treatments.  In the meantime, I recommend that you take ibuprofen as needed for your pain, I have provided you with a  prescription.  I have also listed a few orthopedic providers that you can contact about having the cyst removed.  Thank you for visiting urgent care today.         Disposition Upon Discharge:   Routine symptom specific, illness specific and/or disease specific instructions were discussed with the patient and/or caregiver at length.  Prevention strategies for avoiding STD exposure were also discussed.  As such, the patient has been evaluated and assessed, work-up was performed and treatment was provided in alignment with urgent care protocols and evidence based medicine.  Patient/parent/caregiver has been advised that the patient may require follow up for further testing and/or treatment if the symptoms continue in spite of treatment, as clinically indicated and appropriate.  The patient will follow up with their current PCP if and as advised. If the patient does not currently have a PCP we will assist them in obtaining one.   The patient may need specialty follow up if the symptoms continue, in spite of conservative treatment and management, for further workup, evaluation, consultation and treatment as clinically indicated and appropriate.  Condition: stable for discharge home     Lynden Oxford Scales, [place] 01/06/21 [phone]

## 2021-01-06 NOTE — Discharge Instructions (Addendum)
Your urinalysis today was concerning for possible bacterial infection in your urinary tract, I recommend that you begin taking Bactrim, 1 tablet twice daily for the next 7 days.  Your urine sample will be sent to laboratory for culture to hopefully identify the organism that is causing your infection, you will be notified of that result once it is complete which typically takes 3 to 5 days.  If any changes to your antibiotic regimen needs to be made, you will be advised and the prescriptions will be provided for you.  The results of your STD testing today will be made available to you once received.  They will be posted to your MyChart and, if any of your results are abnormal, you will receive a phone call with those results along with further instructions regarding treatment.  Currently the guidelines do not recommend postexposure treatment for exposure to herpes.  My best recommendation is that you continue to monitor yourself for any new lesions that are exquisitely tender, violently red or have a blisterlike appearance.  If any of these occur, please seek immediate evaluation and request testing for genital herpes.  I provided you with some information about ganglion cysts, causes and treatments.  In the meantime, I recommend that you take ibuprofen as needed for your pain, I have provided you with a prescription.  I have also listed a few orthopedic providers that you can contact about having the cyst removed.  Thank you for visiting urgent care today.

## 2021-01-07 LAB — URINE CULTURE: Culture: <10000 — AB

## 2021-01-08 LAB — CYTOLOGY, (ORAL, ANAL, URETHRAL) ANCILLARY ONLY
Chlamydia: NEGATIVE
Comment: NEGATIVE
Comment: NEGATIVE
Comment: NORMAL
Neisseria Gonorrhea: NEGATIVE
Trichomonas: NEGATIVE

## 2021-03-13 ENCOUNTER — Other Ambulatory Visit: Payer: Self-pay

## 2021-03-13 ENCOUNTER — Ambulatory Visit: Payer: Self-pay

## 2021-03-13 ENCOUNTER — Other Ambulatory Visit: Payer: Self-pay | Admitting: Family Medicine

## 2021-03-13 DIAGNOSIS — M545 Low back pain, unspecified: Secondary | ICD-10-CM

## 2021-03-19 ENCOUNTER — Emergency Department (HOSPITAL_COMMUNITY)
Admission: EM | Admit: 2021-03-19 | Discharge: 2021-03-19 | Disposition: A | Discharge status: Home / Self Care | Payer: PRIVATE HEALTH INSURANCE | Source: Home / Self Care | Attending: Emergency Medicine | Admitting: Emergency Medicine

## 2021-03-19 ENCOUNTER — Other Ambulatory Visit (HOSPITAL_COMMUNITY): Payer: Self-pay

## 2021-03-19 ENCOUNTER — Other Ambulatory Visit: Payer: Self-pay

## 2021-03-19 ENCOUNTER — Ambulatory Visit: Payer: PRIVATE HEALTH INSURANCE | Attending: Family Medicine

## 2021-03-19 DIAGNOSIS — M545 Low back pain, unspecified: Secondary | ICD-10-CM | POA: Insufficient documentation

## 2021-03-19 DIAGNOSIS — M6281 Muscle weakness (generalized): Secondary | ICD-10-CM | POA: Diagnosis present

## 2021-03-19 DIAGNOSIS — S39012A Strain of muscle, fascia and tendon of lower back, initial encounter: Secondary | ICD-10-CM

## 2021-03-19 MED ORDER — LIDOCAINE 5 % EX PTCH
1.0000 | MEDICATED_PATCH | CUTANEOUS | 0 refills | Status: DC
  Filled 2021-03-19: qty 30, 30d supply, fill #0

## 2021-03-19 MED ORDER — NAPROXEN 250 MG PO TABS
500.0000 mg | ORAL_TABLET | Freq: Once | ORAL | Status: AC
Start: 2021-03-19 — End: 2021-03-19
  Administered 2021-03-19: 500 mg via ORAL
  Filled 2021-03-19: qty 2

## 2021-03-19 MED ORDER — CYCLOBENZAPRINE HCL 10 MG PO TABS
10.0000 mg | ORAL_TABLET | Freq: Two times a day (BID) | ORAL | 0 refills | Status: DC | PRN
Start: 2021-03-19 — End: 2021-12-12
  Filled 2021-03-19: qty 20, 10d supply, fill #0

## 2021-03-19 MED ORDER — CYCLOBENZAPRINE HCL 10 MG PO TABS
10.0000 mg | ORAL_TABLET | Freq: Once | ORAL | Status: AC
  Administered 2021-03-19: 10 mg via ORAL
  Filled 2021-03-19: qty 1

## 2021-03-19 MED ORDER — NAPROXEN 500 MG PO TABS
500.0000 mg | ORAL_TABLET | Freq: Two times a day (BID) | ORAL | 0 refills | Status: DC
  Filled 2021-03-19: qty 30, 15d supply, fill #0

## 2021-03-19 NOTE — ED Provider Notes (Signed)
[place] EMERGENCY DEPARTMENT Provider Note   CSN: [id number] Arrival date & time: 03/19/21  0841     History  Chief Complaint  Patient presents with   Back Pain    Todd Holloway is a 26 y.o. male recently diagnosed with a lumbar back sprain presenting today due to back pain.  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  Reports that he was prescribed Flexeril however he was unable to pick it up at the pharmacy because it was not there.  Has started physical therapy, first session this morning.  Also reports using Tylenol and ibuprofen for pain control however it is not helping.  Reports that he fell earlier due to the pain, did not hit his head.  Denies any saddle anesthesia, bowel/bladder dysfunction, IVDU, fevers chills or history of cancer.    Home Medications Prior to Admission medications   Medication Sig Start Date End Date Taking? Authorizing Provider  cyclobenzaprine (FLEXERIL) 10 MG tablet Take 1 tablet (10 mg total) by mouth 2 (two) times daily as needed for muscle spasms. 03/19/21  Yes Toniqua Melamed A, PA-C  lidocaine (LIDODERM) 5 % Place 1 patch onto the skin daily. Remove & Discard patch within 12 hours or as directed by MD 03/19/21  Yes Shivaay Stormont A, PA-C  naproxen (NAPROSYN) 500 MG tablet Take 1 tablet (500 mg total) by mouth 2 (two) times daily. 03/19/21  Yes Olis Viverette A, PA-C  albuterol (PROVENTIL HFA;VENTOLIN HFA) 108 (90 Base) MCG/ACT inhaler Inhale 1-2 puffs into the lungs every 6 (six) hours as needed for wheezing or shortness of breath. 03/11/18   Wieters, Hallie C, PA-C  ondansetron (ZOFRAN ODT) 8 MG disintegrating tablet Take 1 tablet (8 mg total) by mouth every 8 (eight) hours as needed for nausea or vomiting. 11/21/20   Hazel Sams, PA-C      Allergies    Patient has no known allergies.    Review of Systems   Review of Systems  Musculoskeletal:  Positive for  back pain.  As per HPI Physical Exam Updated Vital Signs BP (!) 121/100 (BP Location: Left Arm)    Pulse 74    Temp 97.7 F (36.5 C) (Oral)    Resp 14    SpO2 100%  Physical Exam Vitals and nursing note reviewed.  Constitutional:      Appearance: Normal appearance.  HENT:     Head: Normocephalic and atraumatic.  Eyes:     General: No scleral icterus.    Conjunctiva/sclera: Conjunctivae normal.  Pulmonary:     Effort: Pulmonary effort is normal. No respiratory distress.  Skin:    Findings: No rash.  Neurological:     Mental Status: He is alert.     Gait: Gait normal.     Comments: Patient is ambulatory  Psychiatric:        Mood and Affect: Mood normal.    ED Results / Procedures / Treatments   Labs (all labs ordered are listed, but only abnormal results are displayed) Labs Reviewed - No data to display  EKG None  Radiology No results found.  Procedures Procedures   Medications Ordered in ED Medications  cyclobenzaprine (FLEXERIL) tablet 10 mg (has no administration in time range)  naproxen (NAPROSYN) tablet 500 mg (has no administration in time range)    ED Course/ Medical Decision Making/ A&P  Medical Decision Making Risk Prescription drug management.   26 year old male presenting today due to back pain.  Had physical therapy at this morning and did not like it however reports he will try to keep going.  Requesting his Flexeril be sent somewhere else.  I have sent Flexeril, naproxen twice daily and lidocaine patches to the outpatient pharmacy. Proper use was attached to his discharge papers.  A dose of naproxen and Flexeril given in the department.  And a short duration work note was attached to his papers.  Final Clinical Impression(s) / ED Diagnoses Final diagnoses:  Strain of lumbar region, initial encounter    Rx / DC Orders ED Discharge Orders          Ordered    cyclobenzaprine (FLEXERIL) 10 MG tablet  2 times daily  PRN        03/19/21 0936    naproxen (NAPROSYN) 500 MG tablet  2 times daily        03/19/21 0938    lidocaine (LIDODERM) 5 %  Every 24 hours        03/19/21 0940           Results and diagnoses were explained to the patient. Return precautions discussed in full. Patient had no additional questions and expressed complete understanding.   This chart was dictated using voice recognition software.  Despite best efforts to proofread,  errors can occur which can change the documentation meaning.    Rhae Hammock, PA-C 03/19/21 0955    Godfrey Pick, MD 03/20/21 [phone]

## 2021-03-19 NOTE — ED Triage Notes (Signed)
Pt. Stated, Todd Holloway got lower back pain and I cant hardly walk cause it will give out. I just came from PT. Im out of my medication, That  is what Im here for .

## 2021-03-19 NOTE — Therapy (Signed)
OUTPATIENT PHYSICAL THERAPY THORACOLUMBAR EVALUATION   Patient Name: Todd Holloway MRN: [id number] DOB:[birth year], 26 y.o., male Today's Date: 03/19/2021    Past Medical History:  Diagnosis Date   Asthma    History reviewed. No pertinent surgical history. Patient Active Problem List   Diagnosis Date Noted   HSV (herpes simplex virus) anogenital infection 06/11/2016    PCP: Patient, No Pcp Per (Inactive)  REFERRING PROVIDER: Lanell Persons, MD  REFERRING DIAG: sprain ,lumbar,spine   THERAPY DIAG:  Strain of lumbar region, initial encounter  Acute right-sided low back pain without sciatica  Muscle weakness (generalized)  ONSET DATE: 03/15/2021   SUBJECTIVE:                                                                                                                                                                                           SUBJECTIVE STATEMENT: 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 Yes NPRS scale: 9/10 Pain location: central  Pain orientation: Bilateral  PAIN TYPE: aching and tight Pain description: intermittent and sharp  Aggravating factors: Prolonged standing Relieving factors: undetermined  PRECAUTIONS: None  WEIGHT BEARING RESTRICTIONS No  FALLS:  Has patient fallen in last 6 months? No, Number of falls: 0  LIVING ENVIRONMENT: Lives with: lives alone Lives in: House/apartment  OCCUPATION: transporter  PLOF: Independent  PATIENT GOALS [place] and manage back pain   OBJECTIVE:   DIAGNOSTIC FINDINGS:  Unremarkable   PATIENT SURVEYS:  ODI  SCREENING FOR RED FLAGS: Bowel or bladder incontinence: No  COGNITION:  Overall cognitive status: Within functional limits for tasks assessed     SENSATION:  Light touch: Appears intact   MUSCLE  LENGTH: Hamstrings: Right 75 deg; Left 75 deg Thomas test: UTA due to pain  POSTURE:  Antalgic gait,   PALPATION: Point tender to L SI joint region  LUMBARAROM/PROM  A/PROM A/PROM  03/19/2021  Flexion 25%  Extension 0%  Right lateral flexion 25%  Left lateral flexion 25%  Right rotation   Left rotation    (Blank rows = not tested)  LE AROM/PROM:  A/PROM Right 03/19/2021 Left 03/19/2021  Hip flexion 135 100 pain  Hip extension    Hip abduction    Hip adduction    Hip internal rotation pain pain  Hip external rotation pain pain  Knee flexion    Knee extension    Ankle dorsiflexion    Ankle plantarflexion    Ankle inversion  Ankle eversion    (Pain in L SIJ region)  LE MMT: UTA due to pain levels  MMT Right 03/19/2021 Left 03/19/2021  Hip flexion    Hip extension    Hip abduction    Hip adduction    Hip internal rotation    Hip external rotation    Knee flexion    Knee extension    Ankle dorsiflexion    Ankle plantarflexion    Ankle inversion    Ankle eversion       LUMBAR SPECIAL TESTS:  Straight leg raise test: Negative, SI Compression/distraction test: Positive, and Thomas test: Positive  FUNCTIONAL TESTS:  UTA due to pain  GAIT: Distance walked: 150 Assistive device utilized: None Level of assistance: Complete Independence Comments: antalgic    TODAY'S TREATMENT  Eval and HEP   PATIENT EDUCATION:  Education details: Discussed eval findings, rehab rationale and POC and patient is in agreement  Person educated: Patient Education method: Explanation, Verbal cues, and Handouts Education comprehension: verbalized understanding, verbal cues required, and needs further education   HOME EXERCISE PROGRAM: Access Code: [email or web address] URL: [email or web address] Date: 03/19/2021 Prepared by: Sharlynn Oliphant  Exercises Supine Bridge - 2 x daily - 7 x weekly - 2 sets - 10 reps Modified Thomas Stretch - 2 x daily - 7 x weekly - 1 sets - 1  reps - 120s hold   ASSESSMENT:  CLINICAL IMPRESSION: Patient is a 26 y.o. male  who was seen today for physical therapy evaluation and treatment for L low back pain and suspected SI joint dysfunction.  He demos S&S of a posterior ilial rotation with observed leg length discreancy, R longer than L.  Definitive assessment made difficult as patient unable to tolerate palpation or testing positions.  He has yet to fill his muscle relaxer prescription. Objective impairments include Abnormal gait, difficulty walking, decreased ROM, impaired flexibility, and pain. These impairments are limiting patient from community activity, occupation, and positional changes . Personal factors including Age, Fitness, and Profession are also affecting patient's functional outcome. Patient will benefit from skilled PT to address above impairments and improve overall function.  REHAB POTENTIAL: Good  CLINICAL DECISION MAKING: Evolving/moderate complexity  EVALUATION COMPLEXITY: Moderate   GOALS: Goals reviewed with patient? Yes  SHORT TERM GOALS:  STG Name Target Date Goal status  1 Patient to demonstrate independence in HEP  Baseline: [id number] 04/02/2021 INITIAL  2 Perform ODI and set goal Baseline: TBD 04/02/2021 INITIAL  LONG TERM GOALS:   LTG Name Target Date Goal status  1 Decrease pain to 2/10 Baseline:9/10 04/16/2021 INITIAL  2 Restore pelvic symmetry Baseline: Posterior L ilial rotation 04/16/2021 INITIAL  3 Assess ODI goal Baseline: TBD 04/16/2021 INITIAL  PLAN: PT FREQUENCY: 2x/week  PT DURATION: 4 weeks  PLANNED INTERVENTIONS: Therapeutic exercises, Therapeutic activity, Neuro Muscular re-education, Balance training, Gait training, Patient/Family education, Joint mobilization, Stair training, and Manual therapy  PLAN FOR NEXT SESSION: assess SI joint tenderness, review and expand HEP, ODI, MET as tolerated   Lanice Shirts, PT 03/19/2021, 7:38 AM

## 2021-03-19 NOTE — Discharge Instructions (Addendum)
Continue going to physical therapy.  Also, you may take Tylenol in addition to the naproxen (Aleve) that I am sending to the pharmacy.  Naproxen should not be taken with ibuprofen so you should take the Flexeril and naproxen in the morning and at nighttime. Take with food. Flexeril may make you tired, if so, it is okay to only take it at night.  Lastly, I have sent some lidocaine patches to the pharmacy.  You should only wear a patch for 12 hours at a time.  You must have 12 hours each day without the patch.  You may use up to 3 patches at the same time.  I hope you feel better!

## 2021-03-26 ENCOUNTER — Other Ambulatory Visit: Payer: Self-pay

## 2021-03-26 ENCOUNTER — Ambulatory Visit: Payer: PRIVATE HEALTH INSURANCE | Attending: Family Medicine

## 2021-03-26 DIAGNOSIS — S39012A Strain of muscle, fascia and tendon of lower back, initial encounter: Secondary | ICD-10-CM | POA: Diagnosis not present

## 2021-03-26 DIAGNOSIS — M545 Low back pain, unspecified: Secondary | ICD-10-CM | POA: Diagnosis present

## 2021-03-26 DIAGNOSIS — M6281 Muscle weakness (generalized): Secondary | ICD-10-CM

## 2021-03-26 NOTE — Therapy (Signed)
OUTPATIENT PHYSICAL THERAPY TREATMENT NOTE   Patient Name: Todd Holloway MRN: [id number] DOB:December 12, 1995, 26 y.o., male Today's Date: 03/26/2021  PCP: Patient, No Pcp Per (Inactive) REFERRING PROVIDER: Odis Luster, MD   PT End of Session - 03/26/21 1450     Visit Number 2    Date for PT Re-Evaluation 04/16/21    Authorization Type WC    Progress Note Due on Visit 8    PT Start Time 0102    PT Stop Time 1530    PT Time Calculation (min) 45 min    Activity Tolerance Patient tolerated treatment well    Behavior During Therapy [facility] for tasks assessed/performed             Past Medical History:  Diagnosis Date   Asthma    History reviewed. No pertinent surgical history. Patient Active Problem List   Diagnosis Date Noted   HSV (herpes simplex virus) anogenital infection 06/11/2016    REFERRING DIAG: sprain ,lumbar,spine   THERAPY DIAG:  Strain of lumbar region, initial encounter  Acute right-sided low back pain without sciatica  Muscle weakness (generalized)  PERTINENT HISTORY: History of previous lumbar and cervical strains  PRECAUTIONS: none  SUBJECTIVE: Reports worsening pain in L LS region since last session.  Felt the need to utilize the ER after last session due to elevated pain levels.  Has reverted to using a cane he borrowed.  PAIN:  Are you having pain? Yes NPRS scale: 10/10 Pain location: L LS region Pain orientation: Left  PAIN TYPE: aching, burning, and sharp Pain description: constant, sharp, and stabbing  Aggravating factors: movement and activity Relieving factors: lying supine     OBJECTIVE:    DIAGNOSTIC FINDINGS:  Unremarkable    PATIENT SURVEYS:  ODI 44%   SCREENING FOR RED FLAGS: Bowel or bladder incontinence: No   COGNITION:          Overall cognitive status: Within functional limits for tasks assessed                        SENSATION:          Light touch: Appears intact           MUSCLE LENGTH: Hamstrings:  Right 75 deg; Left 75 deg Thomas test: UTA due to pain   POSTURE:  Antalgic gait,    PALPATION: Point tender to L SI joint region   LUMBARAROM/PROM   A/PROM A/PROM  03/19/2021  Flexion 25%  Extension 0%  Right lateral flexion 25%  Left lateral flexion 25%  Right rotation    Left rotation     (Blank rows = not tested)   LE AROM/PROM:   A/PROM Right 03/19/2021 Left 03/19/2021  Hip flexion 135 100 pain  Hip extension      Hip abduction      Hip adduction      Hip internal rotation pain pain  Hip external rotation pain pain  Knee flexion      Knee extension      Ankle dorsiflexion      Ankle plantarflexion      Ankle inversion      Ankle eversion      (Pain in L SIJ region)   LE MMT: UTA due to pain levels   MMT Right 03/19/2021 Left 03/19/2021  Hip flexion      Hip extension      Hip abduction      Hip adduction  Hip internal rotation      Hip external rotation      Knee flexion      Knee extension      Ankle dorsiflexion      Ankle plantarflexion      Ankle inversion      Ankle eversion          LUMBAR SPECIAL TESTS:   03/26/21: stork test UTA due to pain, slump test negative B, DTRs intact and brisk in BLEs, palpation exquisitely tender to L PSIS   FUNCTIONAL TESTS:  UTA due to pain   GAIT: Distance walked: 150 Assistive device utilized: None Level of assistance: Complete Independence Comments: antalgic       TODAY'S TREATMENT  [facility] Adult PT Treatment:                                                DATE: 03/26/21 Therapeutic Exercise: SKTC 30s x3 Hip IR/ER in supine 15x2, knees extended Heel slides 15x B  GSs 15x 3s hold  QSs 15x 3s hold Ankle pumps x15    PATIENT EDUCATION:  Education details: Discussed eval findings, rehab rationale and POC and patient is in agreement  Person educated: Patient Education method: Explanation, Verbal cues, and Handouts Education comprehension: verbalized understanding, verbal cues required, and needs  further education     HOME EXERCISE PROGRAM:   Access Code: [email or web address] URL: [email or web address] Date: 03/26/2021 Prepared by: Sharlynn Oliphant  Exercises Modified Marcello Moores Stretch - 2 x daily - 7 x weekly - 1 sets - 1 reps - 120s hold Supine Gluteal Sets - 2 x daily - 7 x weekly - 2 sets - 15 reps - 3s hold Supine Ankle Pumps - 2 x daily - 7 x weekly - 2 sets - 15 reps - 3s hold Supine Heel Slide - 2 x daily - 7 x weekly - 2 sets - 15 reps - 3s hold    ASSESSMENT:   CLINICAL IMPRESSION:  Patient arrives to clinic using [place] obtained from family, no change in symptoms, sustained a fall on 03/23/21 as LLE buckled, denies sinjury.  Still unable to obtain accurate assessment of SIJ dysfunction due to pain and intolerance to testing positions. Began general strengthening and mobility exercises as part of HEP.   REHAB POTENTIAL: Good   CLINICAL DECISION MAKING: Evolving/moderate complexity   EVALUATION COMPLEXITY: Moderate     GOALS: Goals reviewed with patient? Yes   SHORT TERM GOALS:   STG Name Target Date Goal status  1 Patient to demonstrate independence in HEP  Baseline: [id number] 04/02/2021 Reviewed and met  2 Perform ODI and set goal Baseline: TBD; 03/26/21 44% 04/02/2021 Goal met  LONG TERM GOALS:    LTG Name Target Date Goal status  1 Decrease pain to 2/10 Baseline:9/10 04/16/2021 INITIAL  2 Restore pelvic symmetry Baseline: Posterior L ilial rotation 04/16/2021 INITIAL  3 Improve ODI to 25% Baseline: 44% 04/16/2021 INITIAL  PLAN: PT FREQUENCY: 2x/week   PT DURATION: 4 weeks   PLANNED INTERVENTIONS: Therapeutic exercises, Therapeutic activity, Neuro Muscular re-education, Balance training, Gait training, Patient/Family education, Joint mobilization, Stair training, and Manual therapy   PLAN FOR NEXT SESSION: Emphasize mobility and ROM, continue to attempt more appropriate assessment of SI region as pain allows, update HEP as tolerated   Lanice Shirts,  PT 03/26/2021, 2:51 PM

## 2021-03-27 ENCOUNTER — Other Ambulatory Visit (HOSPITAL_COMMUNITY): Payer: Self-pay

## 2021-03-28 ENCOUNTER — Ambulatory Visit: Payer: PRIVATE HEALTH INSURANCE

## 2021-03-28 ENCOUNTER — Other Ambulatory Visit: Payer: Self-pay

## 2021-03-28 DIAGNOSIS — S39012A Strain of muscle, fascia and tendon of lower back, initial encounter: Secondary | ICD-10-CM

## 2021-03-28 DIAGNOSIS — M545 Low back pain, unspecified: Secondary | ICD-10-CM

## 2021-03-28 DIAGNOSIS — M6281 Muscle weakness (generalized): Secondary | ICD-10-CM

## 2021-03-28 NOTE — Therapy (Signed)
OUTPATIENT PHYSICAL THERAPY TREATMENT NOTE   Patient Name: Todd Holloway MRN: [id number] DOB:January 11, 1996, 26 y.o., male Today's Date: 03/28/2021  PCP: Patient, No Pcp Per (Inactive) REFERRING PROVIDER: Odis Luster, MD   PT End of Session - 03/28/21 1516     Visit Number 3    Number of Visits 8    Date for PT Re-Evaluation 04/16/21    Authorization Type WC    Progress Note Due on Visit 8    PT Start Time 1025    PT Stop Time 1530    PT Time Calculation (min) 45 min    Activity Tolerance Patient tolerated treatment well    Behavior During Therapy [facility] for tasks assessed/performed              Past Medical History:  Diagnosis Date   Asthma    History reviewed. No pertinent surgical history. Patient Active Problem List   Diagnosis Date Noted   HSV (herpes simplex virus) anogenital infection 06/11/2016    REFERRING DIAG: sprain ,lumbar,spine   THERAPY DIAG: sprain lumbar spine, SIJ dysfunction   PERTINENT HISTORY: History of previous lumbar and cervical strains  PRECAUTIONS: none  SUBJECTIVE: Some relief of pain reported, has been trying to remain active and compliant with HEP, still feels need to use cane  PAIN:  Are you having pain? Yes NPRS scale: 7/10 Pain location: L LS region Pain orientation: Left  PAIN TYPE: aching, burning, and sharp Pain description: constant, sharp, and stabbing  Aggravating factors: movement and activity Relieving factors: lying supine     OBJECTIVE:    DIAGNOSTIC FINDINGS:  Unremarkable    PATIENT SURVEYS:  ODI 44%   SCREENING FOR RED FLAGS: Bowel or bladder incontinence: No   COGNITION:          Overall cognitive status: Within functional limits for tasks assessed                        SENSATION:          Light touch: Appears intact           MUSCLE LENGTH: Hamstrings: Right 75 deg; Left 75 deg Thomas test: UTA due to pain   POSTURE:  Antalgic gait,    PALPATION: Point tender to L SI joint  region   LUMBARAROM/PROM   A/PROM A/PROM  03/19/2021  Flexion 25%  Extension 0%  Right lateral flexion 25%  Left lateral flexion 25%  Right rotation    Left rotation     (Blank rows = not tested)   LE AROM/PROM:   A/PROM Right 03/19/2021 Left 03/19/2021  Hip flexion 135 100 pain  Hip extension      Hip abduction      Hip adduction      Hip internal rotation pain pain  Hip external rotation pain pain  Knee flexion      Knee extension      Ankle dorsiflexion      Ankle plantarflexion      Ankle inversion      Ankle eversion      (Pain in L SIJ region)   LE MMT: UTA due to pain levels   MMT Right 03/19/2021 Left 03/19/2021  Hip flexion      Hip extension      Hip abduction      Hip adduction      Hip internal rotation      Hip external rotation      Knee flexion  Knee extension      Ankle dorsiflexion      Ankle plantarflexion      Ankle inversion      Ankle eversion          LUMBAR SPECIAL TESTS:   03/28/21: positive L stork test, S&S of posterior L ilial rotation and apparent shortening of LLE   FUNCTIONAL TESTS:  UTA due to pain   GAIT: Distance walked: 150 Assistive device utilized: None Level of assistance: Complete Independence Comments: antalgic       TODAY'S TREATMENT   [facility] Adult PT Treatment:                                                DATE: 03/28/21 Therapeutic Exercise: Bridge 15x L quad set 15x 3s hold with R SKTC L hamstring stretch 30s x3 Nustep seat 11 arms 10 L1 x Manual Therapy: AP pressure over ASIS into anterior rotation, 30s hold AP pressure over ASIS to gap joint, 30s hold MET to correct posterior ilial rotation performed in prone Therapeutic Activity: Assessment of SIJ with positive L storks sign L leg shorter, L PSIS lower, L ASIS elevated   [facility] Adult PT Treatment:                                                DATE: 03/26/21 Therapeutic Exercise: SKTC 30s x3 Hip IR/ER in supine 15x2, knees extended Heel slides 15x B   GSs 15x 3s hold  QSs 15x 3s hold Ankle pumps x15    PATIENT EDUCATION:  Education details: Discussed eval findings, rehab rationale and POC and patient is in agreement  Person educated: Patient Education method: Explanation, Verbal cues, and Handouts Education comprehension: verbalized understanding, verbal cues required, and needs further education     HOME EXERCISE PROGRAM:   Access Code: [email or web address] URL: [email or web address] Date: 03/26/2021 Prepared by: Sharlynn Oliphant  Exercises Modified Marcello Moores Stretch - 2 x daily - 7 x weekly - 1 sets - 1 reps - 120s hold Supine Gluteal Sets - 2 x daily - 7 x weekly - 2 sets - 15 reps - 3s hold Supine Ankle Pumps - 2 x daily - 7 x weekly - 2 sets - 15 reps - 3s hold Supine Heel Slide - 2 x daily - 7 x weekly - 2 sets - 15 reps - 3s hold    ASSESSMENT:   CLINICAL IMPRESSION:  Less pain reported and improved tolerance to positional changes noted.  Able to assess SIJ with findings of a potential posterior rotation of his L ilium.  Attempted manual realignment but limited tolerance to manual techniques.  REHAB POTENTIAL: Good   CLINICAL DECISION MAKING: Evolving/moderate complexity   EVALUATION COMPLEXITY: Moderate     GOALS: Goals reviewed with patient? Yes   SHORT TERM GOALS:   STG Name Target Date Goal status  1 Patient to demonstrate independence in HEP  Baseline: [id number] 04/02/2021 Reviewed and met  2 Perform ODI and set goal Baseline: TBD; 03/26/21 44% 04/02/2021 Goal met  LONG TERM GOALS:    LTG Name Target Date Goal status  1 Decrease pain to 2/10 Baseline:9/10 04/16/2021 INITIAL  2 Restore pelvic symmetry Baseline: Posterior L ilial rotation 04/16/2021 INITIAL  3  Improve ODI to 25% Baseline: 44% 04/16/2021 INITIAL  PLAN: PT FREQUENCY: 2x/week   PT DURATION: 4 weeks   PLANNED INTERVENTIONS: Therapeutic exercises, Therapeutic activity, Neuro Muscular re-education, Balance training, Gait training,  Patient/Family education, Joint mobilization, Stair training, and Manual therapy   PLAN FOR NEXT SESSION: Emphasize mobility and ROM, continue to attempt more appropriate assessment of SI region as pain allows, update HEP as tolerated   Lanice Shirts, PT 03/28/2021, 3:20 PM

## 2021-04-01 DIAGNOSIS — S39012A Strain of muscle, fascia and tendon of lower back, initial encounter: Secondary | ICD-10-CM | POA: Diagnosis present

## 2021-04-01 DIAGNOSIS — M6281 Muscle weakness (generalized): Secondary | ICD-10-CM | POA: Diagnosis present

## 2021-04-01 DIAGNOSIS — M545 Low back pain, unspecified: Secondary | ICD-10-CM | POA: Diagnosis present

## 2021-04-02 ENCOUNTER — Ambulatory Visit: Payer: PRIVATE HEALTH INSURANCE

## 2021-04-02 ENCOUNTER — Other Ambulatory Visit: Payer: Self-pay

## 2021-04-02 DIAGNOSIS — M545 Low back pain, unspecified: Secondary | ICD-10-CM

## 2021-04-02 DIAGNOSIS — S39012A Strain of muscle, fascia and tendon of lower back, initial encounter: Secondary | ICD-10-CM | POA: Diagnosis not present

## 2021-04-02 DIAGNOSIS — M6281 Muscle weakness (generalized): Secondary | ICD-10-CM

## 2021-04-02 NOTE — Therapy (Signed)
OUTPATIENT PHYSICAL THERAPY TREATMENT NOTE   Patient Name: Todd Holloway MRN: [id number] DOB:[birth year], 26 y.o., male Today's Date: 04/02/2021  PCP: Patient, No Pcp Per (Inactive) REFERRING PROVIDER: Odis Luster, MD   PT End of Session - 04/02/21 1447     Visit Number 4    Number of Visits 8    Date for PT Re-Evaluation 04/16/21    Authorization Type WC    Progress Note Due on Visit 8    PT Start Time 6720    PT Stop Time 1530    PT Time Calculation (min) 45 min    Activity Tolerance Patient tolerated treatment well    Behavior During Therapy [facility] for tasks assessed/performed              Past Medical History:  Diagnosis Date   Asthma    History reviewed. No pertinent surgical history. Patient Active Problem List   Diagnosis Date Noted   HSV (herpes simplex virus) anogenital infection 06/11/2016    REFERRING DIAG: sprain ,lumbar,spine   THERAPY DIAG: sprain lumbar spine, SIJ dysfunction   PERTINENT HISTORY: History of previous lumbar and cervical strains  PRECAUTIONS: none  SUBJECTIVE: Feels pain has subsided somewhat,  not 6/10, still feels need to use cane. No increase in symptoms following last session. Feels he can return to work duties.  PAIN:  Are you having pain? Yes NPRS scale: 6/10 Pain location: L LS region Pain orientation: Left  PAIN TYPE: aching, burning, and sharp Pain description: constant, sharp, and stabbing  Aggravating factors: movement and activity Relieving factors: lying supine     OBJECTIVE:    DIAGNOSTIC FINDINGS:  Unremarkable    PATIENT SURVEYS:  ODI 44%   SCREENING FOR RED FLAGS: Bowel or bladder incontinence: No   COGNITION:          Overall cognitive status: Within functional limits for tasks assessed                        SENSATION:          Light touch: Appears intact           MUSCLE LENGTH: Hamstrings: Right 75 deg; Left 75 deg Thomas test: UTA due to pain   POSTURE:  Antalgic gait,     PALPATION: Point tender to L SI joint region   LUMBARAROM/PROM   A/PROM A/PROM  03/19/2021  Flexion 25%  Extension 0%  Right lateral flexion 25%  Left lateral flexion 25%  Right rotation    Left rotation     (Blank rows = not tested)   LE AROM/PROM:   A/PROM Right 03/19/2021 Left 03/19/2021  Hip flexion 135 100 pain  Hip extension      Hip abduction      Hip adduction      Hip internal rotation pain pain  Hip external rotation pain pain  Knee flexion      Knee extension      Ankle dorsiflexion      Ankle plantarflexion      Ankle inversion      Ankle eversion      (Pain in L SIJ region)   LE MMT: UTA due to pain levels   MMT Right 03/19/2021 Left 03/19/2021  Hip flexion      Hip extension      Hip abduction      Hip adduction      Hip internal rotation      Hip external rotation  Knee flexion      Knee extension      Ankle dorsiflexion      Ankle plantarflexion      Ankle inversion      Ankle eversion          LUMBAR SPECIAL TESTS:   03/28/21: positive L stork test, S&S of posterior L ilial rotation and apparent shortening of LLE   FUNCTIONAL TESTS:  UTA due to pain   GAIT: Distance walked: 150 Assistive device utilized: None Level of assistance: Complete Independence Comments: antalgic       TODAY'S TREATMENT  [facility] Adult PT Treatment:                                                DATE: 04/02/21 Therapeutic Exercise: Bridge 15x2 B hamstring stretch 30s x3 Nustep seat 11 arms 10 L1 x 8 SKTC 30s x3 B Figure 4 piriformis stretch 30s x3 B Open book 10x B Goblet squat 15# 15x2 Manual Therapy: AP pressure over L ASIS into anterior rotation, 30s hold AP pressure over L ASIS to gap joint, 30s hold  [facility] Adult PT Treatment:                                                DATE: 03/28/21 Therapeutic Exercise: Bridge 15x L quad set 15x 3s hold with R SKTC L hamstring stretch 30s x3 Nustep seat 11 arms 10 L1 x 4 Manual Therapy: AP pressure over ASIS  into anterior rotation, 30s hold AP pressure over ASIS to gap joint, 30s hold MET to correct posterior ilial rotation performed in prone Therapeutic Activity: Assessment of SIJ with positive L storks sign L leg shorter, L PSIS lower, L ASIS elevated   [facility] Adult PT Treatment:                                                DATE: 03/26/21 Therapeutic Exercise: SKTC 30s x3 Hip IR/ER in supine 15x2, knees extended Heel slides 15x B  GSs 15x 3s hold  QSs 15x 3s hold Ankle pumps x15    PATIENT EDUCATION:  Education details: Discussed eval findings, rehab rationale and POC and patient is in agreement  Person educated: Patient Education method: Explanation, Verbal cues, and Handouts Education comprehension: verbalized understanding, verbal cues required, and needs further education     HOME EXERCISE PROGRAM:   Access Code: [email or web address] URL: [email or web address] Date: 03/26/2021 Prepared by: Sharlynn Oliphant  Exercises Modified Marcello Moores Stretch - 2 x daily - 7 x weekly - 1 sets - 1 reps - 120s hold Supine Gluteal Sets - 2 x daily - 7 x weekly - 2 sets - 15 reps - 3s hold Supine Ankle Pumps - 2 x daily - 7 x weekly - 2 sets - 15 reps - 3s hold Supine Heel Slide - 2 x daily - 7 x weekly - 2 sets - 15 reps - 3s hold    ASSESSMENT:   CLINICAL IMPRESSION:  Continues to report a decrease in pain levels, moving and ambulating with less distress.  Pelvic mobility symmetrical in supine and  standing, pelvic mobility symmetrical.  REHAB POTENTIAL: Good   CLINICAL DECISION MAKING: Evolving/moderate complexity   EVALUATION COMPLEXITY: Moderate     GOALS: Goals reviewed with patient? Yes   SHORT TERM GOALS:   STG Name Target Date Goal status  1 Patient to demonstrate independence in HEP  Baseline: [id number] 04/02/2021 Reviewed and met  2 Perform ODI and set goal Baseline: TBD; 03/26/21 44% 04/02/2021 Goal met  LONG TERM GOALS:    LTG Name Target Date Goal status  1 Decrease  pain to 2/10 Baseline:9/10 04/16/2021 INITIAL  2 Restore pelvic symmetry Baseline: Posterior L ilial rotation 04/16/2021 INITIAL  3 Improve ODI to 25% Baseline: 44% 04/16/2021 INITIAL  PLAN: PT FREQUENCY: 2x/week   PT DURATION: 4 weeks   PLANNED INTERVENTIONS: Therapeutic exercises, Therapeutic activity, Neuro Muscular re-education, Balance training, Gait training, Patient/Family education, Joint mobilization, Stair training, and Manual therapy   PLAN FOR NEXT SESSION: Emphasize mobility and ROM, monitor SI joint alignment, update HEP as tolerated, advance into functional tasks as tolerated, lunges, stepping   Lanice Shirts, PT 04/02/2021, 2:48 PM

## 2021-04-04 ENCOUNTER — Ambulatory Visit: Payer: PRIVATE HEALTH INSURANCE

## 2021-04-04 ENCOUNTER — Other Ambulatory Visit: Payer: Self-pay

## 2021-04-04 DIAGNOSIS — S39012A Strain of muscle, fascia and tendon of lower back, initial encounter: Secondary | ICD-10-CM | POA: Diagnosis not present

## 2021-04-04 DIAGNOSIS — M6281 Muscle weakness (generalized): Secondary | ICD-10-CM

## 2021-04-04 DIAGNOSIS — M545 Low back pain, unspecified: Secondary | ICD-10-CM

## 2021-04-04 NOTE — Therapy (Signed)
OUTPATIENT PHYSICAL THERAPY TREATMENT NOTE   Patient Name: Todd Holloway MRN: [id number] DOB:06-08-1995, 26 y.o., male Today's Date: 04/04/2021  PCP: Patient, No Pcp Per (Inactive) REFERRING PROVIDER: Odis Luster, MD   PT End of Session - 04/04/21 1445     Visit Number 5    Number of Visits 8    Date for PT Re-Evaluation 04/16/21    Authorization Type WC    Progress Note Due on Visit 8    PT Start Time 7628    PT Stop Time 1530    PT Time Calculation (min) 45 min    Activity Tolerance Patient tolerated treatment well    Behavior During Therapy [facility] for tasks assessed/performed              Past Medical History:  Diagnosis Date   Asthma    No past surgical history on file. Patient Active Problem List   Diagnosis Date Noted   HSV (herpes simplex virus) anogenital infection 06/11/2016    REFERRING DIAG: sprain ,lumbar,spine   THERAPY DIAG: sprain lumbar spine, SIJ dysfunction   PERTINENT HISTORY: History of previous lumbar and cervical strains  PRECAUTIONS: none  SUBJECTIVE: Continues to do well, feels ready to return to work following next MD f/u on 04/10/21  PAIN:  Are you having pain? Yes NPRS scale: 6/10 Pain location: L LS region Pain orientation: Left  PAIN TYPE: aching, burning, and sharp Pain description: constant, sharp, and stabbing  Aggravating factors: movement and activity Relieving factors: lying supine     OBJECTIVE:    DIAGNOSTIC FINDINGS:  Unremarkable    PATIENT SURVEYS:  ODI 44%   SCREENING FOR RED FLAGS: Bowel or bladder incontinence: No   COGNITION:          Overall cognitive status: Within functional limits for tasks assessed                        SENSATION:          Light touch: Appears intact           MUSCLE LENGTH: Hamstrings: Right 75 deg; Left 75 deg Thomas test: UTA due to pain   POSTURE:  Antalgic gait,    PALPATION: Point tender to L SI joint region   LUMBARAROM/PROM   A/PROM A/PROM   03/19/2021  Flexion 25%  Extension 0%  Right lateral flexion 25%  Left lateral flexion 25%  Right rotation    Left rotation     (Blank rows = not tested)   LE AROM/PROM:   A/PROM Right 04/04/2021 Left 04/04/2021  Hip flexion 135d 135d  Hip extension      Hip abduction      Hip adduction      Hip internal rotation WNL WNL  Hip external rotation WNL WNL4  Knee flexion      Knee extension      Ankle dorsiflexion      Ankle plantarflexion      Ankle inversion      Ankle eversion      (Pain in L SIJ region)   LE MMT: UTA due to pain levels   MMT Right 03/19/2021 Left 03/19/2021  Hip flexion  5  5  Hip extension  5  5  Hip abduction      Hip adduction      Hip internal rotation      Hip external rotation      Knee flexion  5  5  Knee  extension 5   5  Ankle dorsiflexion      Ankle plantarflexion      Ankle inversion      Ankle eversion          LUMBAR SPECIAL TESTS:   03/28/21: positive L stork test, S&S of posterior L ilial rotation and apparent shortening of LLE   FUNCTIONAL TESTS:  UTA due to pain   GAIT: Distance walked: 150 Assistive device utilized: None Level of assistance: Complete Independence Comments: antalgic       TODAY'S TREATMENT   [facility] Adult PT Treatment:                                                DATE: 04/04/21 Therapeutic Exercise: Single leg Bridge 30x B B hamstring stretch 30s x3 Nustep seat 11 arms 10 L4 x 8 Cross body SKTC 30s x2 B Figure 4 piriformis stretch 30s x2 B Open book 10x B Goblet squat 15# 15x2 from airex pad  Lunges to bolster 15x B SLR CW/CCW circles against GTB compression  [facility] Adult PT Treatment:                                                DATE: 04/02/21 Therapeutic Exercise: Bridge 15x2 B hamstring stretch 30s x3 Nustep seat 11 arms 10 L1 x 8 SKTC 30s x3 B Figure 4 piriformis stretch 30s x3 B Open book 10x B Goblet squat 15# 15x2 Manual Therapy: AP pressure over L ASIS into anterior rotation, 30s  hold AP pressure over L ASIS to gap joint, 30s hold  [facility] Adult PT Treatment:                                                DATE: 03/28/21 Therapeutic Exercise: Bridge 15x L quad set 15x 3s hold with R SKTC L hamstring stretch 30s x3 Nustep seat 11 arms 10 L1 x 4 Manual Therapy: AP pressure over ASIS into anterior rotation, 30s hold AP pressure over ASIS to gap joint, 30s hold MET to correct posterior ilial rotation performed in prone Therapeutic Activity: Assessment of SIJ with positive L storks sign L leg shorter, L PSIS lower, L ASIS elevated   [facility] Adult PT Treatment:                                                DATE: 03/26/21 Therapeutic Exercise: SKTC 30s x3 Hip IR/ER in supine 15x2, knees extended Heel slides 15x B  GSs 15x 3s hold  QSs 15x 3s hold Ankle pumps x15    PATIENT EDUCATION:  Education details: Discussed eval findings, rehab rationale and POC and patient is in agreement  Person educated: Patient Education method: Explanation, Verbal cues, and Handouts Education comprehension: verbalized understanding, verbal cues required, and needs further education     HOME EXERCISE PROGRAM:   Access Code: [email or web address] URL: [email or web address] Date: 03/26/2021 Prepared by: Sharlynn Oliphant  Exercises Modified [employer] - 2 x daily -  7 x weekly - 1 sets - 1 reps - 120s hold Supine Gluteal Sets - 2 x daily - 7 x weekly - 2 sets - 15 reps - 3s hold Supine Ankle Pumps - 2 x daily - 7 x weekly - 2 sets - 15 reps - 3s hold Supine Heel Slide - 2 x daily - 7 x weekly - 2 sets - 15 reps - 3s hold    ASSESSMENT:   CLINICAL IMPRESSION:  Rates himself at a high functional level with minimal to no pain to report.  Added resistance and difficulty to all tasks to asess functional level.  Good ROM and flexibility in BLEs, no pain with SIJ stressing.  REHAB POTENTIAL: Good   CLINICAL DECISION MAKING: Evolving/moderate complexity   EVALUATION COMPLEXITY:  Moderate     GOALS: Goals reviewed with patient? Yes   SHORT TERM GOALS:   STG Name Target Date Goal status  1 Patient to demonstrate independence in HEP  Baseline: [id number] 04/02/2021 Reviewed and met  2 Perform ODI and set goal Baseline: TBD; 03/26/21 44% 04/02/2021 Goal met  LONG TERM GOALS:    LTG Name Target Date Goal status  1 Decrease pain to 2/10 Baseline:9/10 04/16/2021 INITIAL  2 Restore pelvic symmetry Baseline: Posterior L ilial rotation 04/16/2021 No asymmetry noted, goal met  3 Improve ODI to 25% Baseline: 44% 04/16/2021 INITIAL  PLAN: PT FREQUENCY: 2x/week   PT DURATION: 4 weeks   PLANNED INTERVENTIONS: Therapeutic exercises, Therapeutic activity, Neuro Muscular re-education, Balance training, Gait training, Patient/Family education, Joint mobilization, Stair training, and Manual therapy   PLAN FOR NEXT SESSION: Continue to emphasize functional tasks and return to work potential.   Lanice Shirts, PT 04/04/2021, 2:46 PM

## 2021-04-09 ENCOUNTER — Other Ambulatory Visit: Payer: Self-pay

## 2021-04-09 ENCOUNTER — Ambulatory Visit: Payer: PRIVATE HEALTH INSURANCE

## 2021-04-09 DIAGNOSIS — M6281 Muscle weakness (generalized): Secondary | ICD-10-CM

## 2021-04-09 DIAGNOSIS — S39012A Strain of muscle, fascia and tendon of lower back, initial encounter: Secondary | ICD-10-CM | POA: Diagnosis not present

## 2021-04-09 DIAGNOSIS — M545 Low back pain, unspecified: Secondary | ICD-10-CM

## 2021-04-09 NOTE — Therapy (Addendum)
OUTPATIENT PHYSICAL THERAPY TREATMENT NOTE/DC SUMMARY   Patient Name: Todd Holloway MRN: [id number] DOB:09/28/1995, 26 y.o., male Today's Date: 04/09/2021  PCP: Patient, No Pcp Per (Inactive) REFERRING PROVIDER: Odis Luster, MD PHYSICAL THERAPY DISCHARGE SUMMARY  Visits from Start of Care: 6  Current functional level related to goals / functional outcomes: Goals met   Remaining deficits: None noted   Education / Equipment: HEP   Patient agrees to discharge. Patient goals were met. Patient is being discharged due to being pleased with the current functional level.    PT End of Session - 04/09/21 1446     Visit Number 6    Number of Visits 8    Date for PT Re-Evaluation 04/16/21    Authorization Type WC    Progress Note Due on Visit 8    PT Start Time 3976    PT Stop Time 1530    PT Time Calculation (min) 45 min    Activity Tolerance Patient tolerated treatment well    Behavior During Therapy WFL for tasks assessed/performed              Past Medical History:  Diagnosis Date   Asthma    History reviewed. No pertinent surgical history. Patient Active Problem List   Diagnosis Date Noted   HSV (herpes simplex virus) anogenital infection 06/11/2016    REFERRING DIAG: sprain ,lumbar,spine   THERAPY DIAG: sprain lumbar spine, SIJ dysfunction   PERTINENT HISTORY: History of previous lumbar and cervical strains  PRECAUTIONS: none  SUBJECTIVE: Doing well, no pain over the weekend   PAIN:  Are you having pain? Yes NPRS scale: 0/10 Pain location: L LS region Pain orientation: Left  PAIN TYPE: aching, burning, and sharp Pain description: constant, sharp, and stabbing  Aggravating factors: movement and activity Relieving factors: lying supine     OBJECTIVE:    DIAGNOSTIC FINDINGS:  Unremarkable    PATIENT SURVEYS:  ODI 44%; 04/09/21 ODI 4%   SCREENING FOR RED FLAGS: Bowel or bladder incontinence: No   COGNITION:          Overall  cognitive status: Within functional limits for tasks assessed                        SENSATION:          Light touch: Appears intact           MUSCLE LENGTH: Hamstrings: Right 75 deg; Left 75 deg Thomas test: UTA due to pain   POSTURE:  Antalgic gait,    PALPATION: Point tender to L SI joint region   LUMBARAROM/PROM   A/PROM A/PROM  03/19/2021  Flexion 75%  Extension 75%  Right lateral flexion 75%  Left lateral flexion 75%  Right rotation    Left rotation     (Blank rows = not tested)   LE AROM/PROM:   A/PROM Right 04/04/2021 Left 04/04/2021  Hip flexion 135d 135d  Hip extension      Hip abduction      Hip adduction      Hip internal rotation WNL WNL  Hip external rotation WNL WNL  Knee flexion      Knee extension      Ankle dorsiflexion      Ankle plantarflexion      Ankle inversion      Ankle eversion      (Pain in L SIJ region)   LE MMT: UTA due to pain levels   MMT Right 03/19/2021  Left 03/19/2021  Hip flexion  5  5  Hip extension  5  5  Hip abduction      Hip adduction      Hip internal rotation      Hip external rotation      Knee flexion  5  5  Knee extension   5   5  Ankle dorsiflexion      Ankle plantarflexion      Ankle inversion      Ankle eversion          LUMBAR SPECIAL TESTS:   03/28/21: positive L stork test, S&S of posterior L ilial rotation and apparent shortening of LLE   FUNCTIONAL TESTS:  UTA due to pain   GAIT: Distance walked: 150 Assistive device utilized: None Level of assistance: Complete Independence Comments: antalgic       TODAY'S TREATMENT  [facility] Adult PT Treatment:                                                DATE: 04/09/21 Therapeutic Exercise: Single leg Bridge 30x B from airex pad B hamstring stretch 30s x3 Treadmill 1.0 MPH 2% grade x8 min Figure 4 piriformis stretch 30s x2 B Open book 10x B w/weighted ball Goblet squat 25# 15x2 from airex pad  Lunges to Airex 15x B with OH p-ball hold SLR CW/CCW circles  against Green power band compression 60/60s duration B Push/pull from pulleys, 10# per side to simulate work tasks, 5x each direction    [facility] Adult PT Treatment:                                                DATE: 04/04/21 Therapeutic Exercise: Single leg Bridge 30x B B hamstring stretch 30s x3 Nustep seat 11 arms 10 L4 x 8 Cross body SKTC 30s x2 B Figure 4 piriformis stretch 30s x2 B Open book 10x B Goblet squat 15# 15x2 from airex pad  Lunges to bolster 15x B SLR CW/CCW circles against GTB compression  [facility] Adult PT Treatment:                                                DATE: 04/02/21 Therapeutic Exercise: Bridge 15x2 B hamstring stretch 30s x3 Nustep seat 11 arms 10 L1 x 8 SKTC 30s x3 B Figure 4 piriformis stretch 30s x3 B Open book 10x B Goblet squat 15# 15x2 Manual Therapy: AP pressure over L ASIS into anterior rotation, 30s hold AP pressure over L ASIS to gap joint, 30s hold  [facility] Adult PT Treatment:                                                DATE: 03/28/21 Therapeutic Exercise: Bridge 15x L quad set 15x 3s hold with R SKTC L hamstring stretch 30s x3 Nustep seat 11 arms 10 L1 x 4 Manual Therapy: AP pressure over ASIS into anterior rotation, 30s hold AP pressure over ASIS  to gap joint, 30s hold MET to correct posterior ilial rotation performed in prone Therapeutic Activity: Assessment of SIJ with positive L storks sign L leg shorter, L PSIS lower, L ASIS elevated   [facility] Adult PT Treatment:                                                DATE: 03/26/21 Therapeutic Exercise: SKTC 30s x3 Hip IR/ER in supine 15x2, knees extended Heel slides 15x B  GSs 15x 3s hold  QSs 15x 3s hold Ankle pumps x15    PATIENT EDUCATION:  Education details: Discussed eval findings, rehab rationale and POC and patient is in agreement  Person educated: Patient Education method: Explanation, Verbal cues, and Handouts Education comprehension: verbalized understanding, verbal cues  required, and needs further education     HOME EXERCISE PROGRAM:   Access Code: [email or web address] URL: [email or web address] Date: 03/26/2021 Prepared by: Sharlynn Oliphant  Exercises Modified Marcello Moores Stretch - 2 x daily - 7 x weekly - 1 sets - 1 reps - 120s hold Supine Gluteal Sets - 2 x daily - 7 x weekly - 2 sets - 15 reps - 3s hold Supine Ankle Pumps - 2 x daily - 7 x weekly - 2 sets - 15 reps - 3s hold Supine Heel Slide - 2 x daily - 7 x weekly - 2 sets - 15 reps - 3s hold    ASSESSMENT:   CLINICAL IMPRESSION: Patient had a pain-free weekend and feels ready to return to work tasks.  Increased difficulty of activities today and added work simulation w/o any exacerbation of symptoms.  Rates overall disability level at 4%  REHAB POTENTIAL: Good   CLINICAL DECISION MAKING: Evolving/moderate complexity   EVALUATION COMPLEXITY: Moderate     GOALS: Goals reviewed with patient? Yes   SHORT TERM GOALS:   STG Name Target Date Goal status  1 Patient to demonstrate independence in HEP  Baseline: [id number] 04/02/2021 Reviewed and met  2 Perform ODI and set goal Baseline: TBD; 03/26/21 44% 04/02/2021 Goal met  LONG TERM GOALS:    LTG Name Target Date Goal status  1 Decrease pain to 2/10 Baseline:9/10; 04/09/21 0/10 04/16/2021 Met  2 Restore pelvic symmetry Baseline: Posterior L ilial rotation 04/16/2021 No asymmetry noted, goal met  3 Improve ODI to 25% Baseline: 44%; 04/09/21 4% 04/16/2021 INITIAL  PLAN: PT FREQUENCY: 2x/week   PT DURATION: 4 weeks   PLANNED INTERVENTIONS: Therapeutic exercises, Therapeutic activity, Neuro Muscular re-education, Balance training, Gait training, Patient/Family education, Joint mobilization, Stair training, and Manual therapy   PLAN FOR NEXT SESSION: Anticipate DC pending MD f/u   Lanice Shirts, PT 04/09/2021, 2:48 PM

## 2021-04-11 ENCOUNTER — Telehealth: Payer: Self-pay

## 2021-04-11 ENCOUNTER — Ambulatory Visit: Payer: PRIVATE HEALTH INSURANCE | Attending: Family Medicine

## 2021-04-11 NOTE — Telephone Encounter (Signed)
VM left regarding missed visit as well as current status on return to work. ?

## 2021-04-16 ENCOUNTER — Ambulatory Visit: Payer: PRIVATE HEALTH INSURANCE

## 2021-04-18 ENCOUNTER — Ambulatory Visit: Payer: PRIVATE HEALTH INSURANCE

## 2021-09-26 ENCOUNTER — Emergency Department (HOSPITAL_COMMUNITY): Payer: Medicaid Other

## 2021-09-26 ENCOUNTER — Emergency Department (HOSPITAL_COMMUNITY)
Admission: EM | Admit: 2021-09-26 | Discharge: 2021-09-27 | Disposition: A | Discharge status: Home / Self Care | Payer: Medicaid Other | Attending: Emergency Medicine | Admitting: Emergency Medicine

## 2021-09-26 ENCOUNTER — Other Ambulatory Visit: Payer: Self-pay

## 2021-09-26 DIAGNOSIS — J45909 Unspecified asthma, uncomplicated: Secondary | ICD-10-CM | POA: Insufficient documentation

## 2021-09-26 DIAGNOSIS — R0789 Other chest pain: Secondary | ICD-10-CM | POA: Insufficient documentation

## 2021-09-26 DIAGNOSIS — R55 Syncope and collapse: Secondary | ICD-10-CM | POA: Insufficient documentation

## 2021-09-26 DIAGNOSIS — E876 Hypokalemia: Secondary | ICD-10-CM | POA: Insufficient documentation

## 2021-09-26 LAB — BASIC METABOLIC PANEL
Anion gap: 7 (ref 5–15)
BUN: 8 mg/dL (ref 6–20)
CO2: 29 mmol/L (ref 22–32)
Calcium: 9.6 mg/dL (ref 8.9–10.3)
Chloride: 102 mmol/L (ref 98–111)
Creatinine, Ser: 1.02 mg/dL (ref 0.61–1.24)
GFR, Estimated: >60 mL/min (ref >60)
Glucose, Bld: 129 mg/dL — ABNORMAL HIGH (ref 70–99)
Potassium: 3.4 mmol/L — ABNORMAL LOW (ref 3.5–5.1)
Sodium: 138 mmol/L (ref 135–145)

## 2021-09-26 LAB — CBC
HCT: 41.5 % (ref 39.0–52.0)
Hemoglobin: 14.4 g/dL (ref 13.0–17.0)
MCH: 30.7 pg (ref 26.0–34.0)
MCHC: 34.7 g/dL (ref 30.0–36.0)
MCV: 88.5 fL (ref 80.0–100.0)
Platelets: 254 10*3/uL (ref 150–400)
RBC: 4.69 MIL/uL (ref 4.22–5.81)
RDW: 11.9 % (ref 11.5–15.5)
WBC: 6.9 10*3/uL (ref 4.0–10.5)
nRBC: 0 % (ref 0.0–0.2)

## 2021-09-26 LAB — CBG MONITORING, ED: Glucose-Capillary: 120 mg/dL — ABNORMAL HIGH (ref 70–99)

## 2021-09-26 LAB — TROPONIN I (HIGH SENSITIVITY): Troponin I (High Sensitivity): <2 ng/L (ref <18)

## 2021-09-26 NOTE — ED Notes (Signed)
Pt's orientation has improved. Unable to recall events prior to LOC.

## 2021-09-26 NOTE — ED Triage Notes (Signed)
Pt working in hospital. Nunzio Cobbs states noticing pt's legs had given out and  had a syncopal episode. This RN responded finding pt on floor. Pt transported to triage in wheelchair. Pt unable to recall events. Pt c/o chest pain.
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
## 2021-09-26 NOTE — ED Notes (Signed)
No c-collar per triage PA

## 2021-09-27 LAB — TROPONIN I (HIGH SENSITIVITY): Troponin I (High Sensitivity): <2 ng/L (ref <18)

## 2021-09-27 MED ORDER — SODIUM CHLORIDE 0.9 % IV BOLUS
1000.0000 mL | Freq: Once | INTRAVENOUS | Status: AC
Start: 2021-09-27 — End: 2021-09-27
  Administered 2021-09-27: 1000 mL via INTRAVENOUS

## 2021-09-27 MED ORDER — ALUM & MAG HYDROXIDE-SIMETH 200-200-20 MG/5ML PO SUSP
30.0000 mL | Freq: Once | ORAL | Status: AC
  Administered 2021-09-27: 30 mL via ORAL
  Filled 2021-09-27: qty 30

## 2021-09-27 NOTE — ED Provider Notes (Signed)
[facility] EMERGENCY DEPARTMENT Provider Note  CSN: [id number] Arrival date & time: 09/26/21 2119  Chief Complaint(s) Loss of Consciousness  HPI Todd Holloway is a 26 y.o. male who presents to the emergency department after having a syncopal episode here in the hospital while at work.  The patient lives [address] about [address].  Patient was reportedly coming from [employer].  He states that he began feeling lightheaded and had tunnel vision.  When he awoke he was surrounded by several people.  He is unsure of how long he was out for.  Per transport/bystander report the syncopal episode was witnessed but there was no time given.  Patient reports that he has been having chest discomfort for the entire day.  States that this is constant and fluctuating in nature.  Feels like its getting felt in the chest.  No associated shortness of breath or diaphoresis.  Nonexertional.  Nonradiating. Patient reports that he has had this chest pain in the past before, including last week.  Patient denies any cardiac history.  No recent travel.  She did state that he usually walks home from work in the heat.  States that he did that yesterday.  He also states that he was unable to sleep well last night and when he awoke this morning he felt out of it.  The history is provided by the patient.    Past Medical History Past Medical History:  Diagnosis Date   Asthma    Patient Active Problem List   Diagnosis Date Noted   HSV (herpes simplex virus) anogenital infection 06/11/2016   Home Medication(s) Prior to Admission medications   Medication Sig Start Date End Date Taking? Authorizing Provider  albuterol (PROVENTIL HFA;VENTOLIN HFA) 108 (90 Base) MCG/ACT inhaler Inhale 1-2 puffs into the lungs every 6 (six) hours as needed for wheezing or shortness of breath. Patient not taking: Reported on 09/27/2021 03/11/18   Wieters, Hallie C, PA-C  cyclobenzaprine (FLEXERIL) 10 MG tablet Take 1  tablet (10 mg total) by mouth 2 (two) times daily as needed for muscle spasms. Patient not taking: Reported on 09/27/2021 03/19/21   Redwine, Madison A, PA-C  lidocaine (LIDODERM) 5 % Place 1 patch onto the skin daily. Remove & Discard patch within 12 hours or as directed by MD Patient not taking: Reported on 09/27/2021 03/19/21   Redwine, Madison A, PA-C  naproxen (NAPROSYN) 500 MG tablet Take 1 tablet (500 mg total) by mouth 2 (two) times daily. Patient not taking: Reported on 09/27/2021 03/19/21   Redwine, Madison A, PA-C  ondansetron (ZOFRAN ODT) 8 MG disintegrating tablet Take 1 tablet (8 mg total) by mouth every 8 (eight) hours as needed for nausea or vomiting. Patient not taking: Reported on 09/27/2021 11/21/20   Rhys Martini, PA-C  Allergies Patient has no known allergies.  Review of Systems Review of Systems As noted in HPI  Physical Exam Vital Signs  I have reviewed the triage vital signs BP 122/81 (BP Location: Right Arm)   Pulse 72   Temp 98.1 F (36.7 C) (Oral)   Resp 16   Ht 6\' 2"  (1.88 m)   Wt 90.7 kg   SpO2 98%   BMI 25.68 kg/m   Physical Exam Vitals reviewed.  Constitutional:      General: He is not in acute distress.    Appearance: He is well-developed. He is not diaphoretic.  HENT:     Head: Normocephalic and atraumatic.     Nose: Nose normal.  Eyes:     General: No scleral icterus.       Right eye: No discharge.        Left eye: No discharge.     Conjunctiva/sclera: Conjunctivae normal.     Pupils: Pupils are equal, round, and reactive to light.  Cardiovascular:     Rate and Rhythm: Normal rate and regular rhythm.     Heart sounds: No murmur heard.    No friction rub. No gallop.  Pulmonary:     Effort: Pulmonary effort is normal. No respiratory distress.     Breath sounds: Normal breath sounds. No stridor. No rales.   Abdominal:     General: There is no distension.     Palpations: Abdomen is soft.     Tenderness: There is no abdominal tenderness.  Musculoskeletal:        General: No tenderness.     Cervical back: Normal range of motion and neck supple.     Right lower leg: No edema.     Left lower leg: No edema.  Skin:    General: Skin is warm and dry.     Findings: No erythema or rash.  Neurological:     Mental Status: He is alert and oriented to person, place, and time.     ED Results and Treatments Labs (all labs ordered are listed, but only abnormal results are displayed) Labs Reviewed  BASIC METABOLIC PANEL - Abnormal; Notable for the following components:      Result Value   Potassium 3.4 (*)    Glucose, Bld 129 (*)    All other components within normal limits  CBG MONITORING, ED - Abnormal; Notable for the following components:   Glucose-Capillary 120 (*)    All other components within normal limits  CBC  TROPONIN I (HIGH SENSITIVITY)  TROPONIN I (HIGH SENSITIVITY)                                                                                                                         EKG  EKG Interpretation  Date/Time:  Wednesday September 26 2021 21:43:36 EDT Ventricular Rate:  75 PR Interval:  148 QRS Duration: 74 QT Interval:  372 QTC Calculation: 415 R Axis:   55 Text Interpretation: Normal sinus rhythm  Nonspecific T wave abnormality Abnormal ECG No significant change was found When compared with ECG of 07-May-2020 13:38, PREVIOUS ECG IS PRESENT Confirmed by Drema Pry [phone]) on 09/27/2021 1:10:12 AM       Radiology DG Chest 2 View  Result Date: 09/26/2021 CLINICAL DATA:  Chest pain. EXAM: CHEST - 2 VIEW COMPARISON:  Chest x-ray 06/17/2020 FINDINGS: The heart size and mediastinal contours are within normal limits. Both lungs are clear. The visualized skeletal structures are unremarkable. IMPRESSION: No active cardiopulmonary disease. Electronically Signed   By: Darliss Cheney M.D.   On: 09/26/2021 22:11    Medications Ordered in ED Medications  sodium chloride 0.9 % bolus 1,000 mL (0 mLs Intravenous Stopped 09/27/21 0423)  alum & mag hydroxide-simeth (MAALOX/MYLANTA) 200-200-20 MG/5ML suspension 30 mL (30 mLs Oral Given 09/27/21 0203)                                                                                                                                     Procedures Procedures  (including critical care time)  Medical Decision Making / ED Course   Medical Decision Making Amount and/or Complexity of Data Reviewed Labs: ordered. Decision-making details documented in ED Course. Radiology: ordered and independent interpretation performed. Decision-making details documented in ED Course. ECG/medicine tests: ordered and independent interpretation performed. Decision-making details documented in ED Course.  Risk OTC drugs.   Syncopal episode  Given the prodromal symptoms, appears to be vasovagal/orthostatic in nature. Given the chest pain, will rule out cardiac etiology. Will assess for electrolyte or metabolic derangements, severe anemia.  CBC without leukocytosis or anemia Metabolic panel with mild hypokalemia.  No evidence of renal sufficiency. Initial troponin negative Second troponin negative.  Chest x-ray without evidence of pneumonia, pneumothorax, pulmonary edema or pleural effusions.  No cardiomegaly.  Orthostatics reassuring the patient was symptomatic Patient provided with IV fluids.  Chest pain is atypical for ACS.  Above work-up was reassuring.  Feel this is patient to rule out ACS given the clinical picture. Presentation is not classic for arctic dissection or esophageal perforation.. Low suspicion for pulmonary embolism and patient is PERC negative.  After IV fluids, orthostatics were repeated and patient was no longer symptomatic.       Final Clinical Impression(s) / ED Diagnoses Final diagnoses:  Syncope and  collapse  Low serum potassium   The patient appears reasonably screened and/or stabilized for discharge and I doubt any other medical condition or other [facility] requiring further screening, evaluation, or treatment in the ED at this time. I have discussed the findings, Dx and Tx plan with the patient/family who expressed understanding and agree(s) with the plan. Discharge instructions discussed at length. The patient/family was given strict return precautions who verbalized understanding of the instructions. No further questions at time of discharge.  Disposition: Discharge  Condition: Good  ED Discharge Orders     None       Follow  Up: Primary care provider  Schedule an appointment as soon as possible for a visit  if you do not have a primary care physician, contact [facility] at [phone] for referral           This chart was dictated using voice recognition software.  Despite best efforts to proofread,  errors can occur which can change the documentation meaning.    Nira Conn, MD 09/27/21 [phone]

## 2021-09-27 NOTE — ED Notes (Signed)
Pt reports feeling weak, having difficulty remaining standing during orthostatics

## 2021-11-02 ENCOUNTER — Encounter (HOSPITAL_COMMUNITY): Payer: Self-pay

## 2021-11-02 ENCOUNTER — Ambulatory Visit (HOSPITAL_COMMUNITY)
Admission: EM | Admit: 2021-11-02 | Discharge: 2021-11-02 | Disposition: A | Discharge status: Home / Self Care | Payer: Medicaid Other | Attending: Physician Assistant | Admitting: Physician Assistant

## 2021-11-02 DIAGNOSIS — Z113 Encounter for screening for infections with a predominantly sexual mode of transmission: Secondary | ICD-10-CM | POA: Insufficient documentation

## 2021-11-02 DIAGNOSIS — Z202 Contact with and (suspected) exposure to infections with a predominantly sexual mode of transmission: Secondary | ICD-10-CM

## 2021-11-02 NOTE — ED Triage Notes (Signed)
Pt states he wants to be tested for STD's.  Denies any symptoms.

## 2021-11-02 NOTE — Discharge Instructions (Addendum)
Will call with test results and treat if indicated.  Abstain from sexually activity until test results are back and any necessary treatment has been completed.

## 2021-11-02 NOTE — ED Provider Notes (Signed)
[place]    CSN: [id number] Arrival date & time: 11/02/21  1636      History   Chief Complaint Chief Complaint  Patient presents with   SEXUALLY TRANSMITTED DISEASE    HPI Todd Holloway is a 26 y.o. male.   Pt presents for STD testing.  Reports he is currently asymptomatic.  He defers HIV and syphilis testing today.  Reports he was tested for those last week.      Past Medical History:  Diagnosis Date   Asthma     Patient Active Problem List   Diagnosis Date Noted   HSV (herpes simplex virus) anogenital infection 06/11/2016    History reviewed. No pertinent surgical history.     Home Medications    Prior to Admission medications   Medication Sig Start Date End Date Taking? Authorizing Provider  albuterol (PROVENTIL HFA;VENTOLIN HFA) 108 (90 Base) MCG/ACT inhaler Inhale 1-2 puffs into the lungs every 6 (six) hours as needed for wheezing or shortness of breath. Patient not taking: Reported on 09/27/2021 03/11/18   Wieters, Hallie C, PA-C  cyclobenzaprine (FLEXERIL) 10 MG tablet Take 1 tablet (10 mg total) by mouth 2 (two) times daily as needed for muscle spasms. Patient not taking: Reported on 09/27/2021 03/19/21   Redwine, Madison A, PA-C  lidocaine (LIDODERM) 5 % Place 1 patch onto the skin daily. Remove & Discard patch within 12 hours or as directed by MD Patient not taking: Reported on 09/27/2021 03/19/21   Redwine, Madison A, PA-C  naproxen (NAPROSYN) 500 MG tablet Take 1 tablet (500 mg total) by mouth 2 (two) times daily. Patient not taking: Reported on 09/27/2021 03/19/21   Redwine, Madison A, PA-C  ondansetron (ZOFRAN ODT) 8 MG disintegrating tablet Take 1 tablet (8 mg total) by mouth every 8 (eight) hours as needed for nausea or vomiting. Patient not taking: Reported on 09/27/2021 11/21/20   Hazel Sams, PA-C    Family History Family History  Problem Relation Age of Onset   Asthma Father     Social History Social History   Tobacco Use    Smoking status: Never   Smokeless tobacco: Never  Vaping Use   Vaping Use: Never used  Substance Use Topics   Alcohol use: No   Drug use: No     Allergies   Patient has no known allergies.   Review of Systems Review of Systems  Constitutional:  Negative for chills and fever.  HENT:  Negative for ear pain and sore throat.   Eyes:  Negative for pain and visual disturbance.  Respiratory:  Negative for cough and shortness of breath.   Cardiovascular:  Negative for chest pain and palpitations.  Gastrointestinal:  Negative for abdominal pain and vomiting.  Genitourinary:  Negative for dysuria and hematuria.  Musculoskeletal:  Negative for arthralgias and back pain.  Skin:  Negative for color change and rash.  Neurological:  Negative for seizures and syncope.  All other systems reviewed and are negative.    Physical Exam Triage Vital Signs ED Triage Vitals  Enc Vitals Group     BP 11/02/21 1644 121/79     Pulse Rate 11/02/21 1644 75     Resp 11/02/21 1644 16     Temp 11/02/21 1644 98.2 F (36.8 C)     Temp Source 11/02/21 1644 Oral     SpO2 11/02/21 1644 99 %     Weight --      Height --  Head Circumference --      Peak Flow --      Pain Score 11/02/21 1646 0     Pain Loc --      Pain Edu? --      Excl. in GC? --    No data found.  Updated Vital Signs BP 121/79 (BP Location: Left Arm)   Pulse 75   Temp 98.2 F (36.8 C) (Oral)   Resp 16   SpO2 99%   Visual Acuity Right Eye Distance:   Left Eye Distance:   Bilateral Distance:    Right Eye Near:   Left Eye Near:    Bilateral Near:     Physical Exam Vitals and nursing note reviewed.  Constitutional:      General: He is not in acute distress.    Appearance: He is well-developed.  HENT:     Head: Normocephalic and atraumatic.  Eyes:     Conjunctiva/sclera: Conjunctivae normal.  Cardiovascular:     Rate and Rhythm: Normal rate and regular rhythm.     Heart sounds: No murmur heard. Pulmonary:      Effort: Pulmonary effort is normal. No respiratory distress.     Breath sounds: Normal breath sounds.  Abdominal:     Palpations: Abdomen is soft.     Tenderness: There is no abdominal tenderness.  Musculoskeletal:        General: No swelling.     Cervical back: Neck supple.  Skin:    General: Skin is warm and dry.     Capillary Refill: Capillary refill takes less than 2 seconds.  Neurological:     Mental Status: He is alert.  Psychiatric:        Mood and Affect: Mood normal.      UC Treatments / Results  Labs (all labs ordered are listed, but only abnormal results are displayed) Labs Reviewed - No data to display  EKG   Radiology No results found.  Procedures Procedures (including critical care time)  Medications Ordered in UC Medications - No data to display  Initial Impression / Assessment and Plan / UC Course  I have reviewed the triage vital signs and the nursing notes.  Pertinent labs & imaging results that were available during my care of the patient were reviewed by me and considered in my medical decision making (see chart for details).     STD screening today.  Ctyology self swab in clinic today testing for gonorrhea, chlamydia, and trich.  Pt defers HIV and RPR in clinic today.  He is asymptomatic, will treat if test results are positive.  Final Clinical Impressions(s) / UC Diagnoses   Final diagnoses:  Screen for STD (sexually transmitted disease)     Discharge Instructions      Will call with test results and treat if indicated.  Abstain from sexually activity until test results are back and any necessary treatment has been completed.    ED Prescriptions   None    PDMP not reviewed this encounter.   Ward, Tylene Fantasia, PA-C 11/02/21 1656

## 2021-11-05 ENCOUNTER — Encounter (HOSPITAL_COMMUNITY): Payer: Self-pay | Admitting: Emergency Medicine

## 2021-11-05 ENCOUNTER — Ambulatory Visit (HOSPITAL_COMMUNITY)
Admission: EM | Admit: 2021-11-05 | Discharge: 2021-11-05 | Disposition: A | Discharge status: Home / Self Care | Payer: Medicaid Other

## 2021-11-05 ENCOUNTER — Telehealth (HOSPITAL_COMMUNITY): Payer: Self-pay

## 2021-11-05 LAB — CYTOLOGY, (ORAL, ANAL, URETHRAL) ANCILLARY ONLY
Comment: NEGATIVE
Trichomonas: POSITIVE — AB

## 2021-11-05 MED ORDER — METRONIDAZOLE 500 MG PO TABS: 500.0000 mg | ORAL_TABLET | Freq: Two times a day (BID) | ORAL | 0 refills | Status: DC

## 2021-11-05 NOTE — ED Triage Notes (Signed)
Pt presents to UC for STD treatment. States he tested positive for Trich. Pt advised medication for Dalbert Batman will be sent to the pharmacy. Pt verbalized understanding.

## 2021-12-12 ENCOUNTER — Emergency Department (HOSPITAL_COMMUNITY): Payer: PRIVATE HEALTH INSURANCE

## 2021-12-12 ENCOUNTER — Emergency Department (HOSPITAL_COMMUNITY): Payer: Medicaid Other

## 2021-12-12 ENCOUNTER — Emergency Department (HOSPITAL_COMMUNITY)
Admission: EM | Admit: 2021-12-12 | Discharge: 2021-12-12 | Disposition: A | Discharge status: Home / Self Care | Payer: PRIVATE HEALTH INSURANCE | Attending: Emergency Medicine | Admitting: Emergency Medicine

## 2021-12-12 ENCOUNTER — Encounter (HOSPITAL_COMMUNITY): Payer: Self-pay

## 2021-12-12 ENCOUNTER — Other Ambulatory Visit: Payer: Self-pay

## 2021-12-12 DIAGNOSIS — M5432 Sciatica, left side: Secondary | ICD-10-CM | POA: Insufficient documentation

## 2021-12-12 DIAGNOSIS — R209 Unspecified disturbances of skin sensation: Secondary | ICD-10-CM | POA: Insufficient documentation

## 2021-12-12 DIAGNOSIS — J45909 Unspecified asthma, uncomplicated: Secondary | ICD-10-CM | POA: Insufficient documentation

## 2021-12-12 DIAGNOSIS — M545 Low back pain, unspecified: Secondary | ICD-10-CM | POA: Diagnosis present

## 2021-12-12 DIAGNOSIS — M5442 Lumbago with sciatica, left side: Secondary | ICD-10-CM

## 2021-12-12 DIAGNOSIS — M546 Pain in thoracic spine: Secondary | ICD-10-CM | POA: Insufficient documentation

## 2021-12-12 DIAGNOSIS — Z7951 Long term (current) use of inhaled steroids: Secondary | ICD-10-CM | POA: Diagnosis not present

## 2021-12-12 DIAGNOSIS — R531 Weakness: Secondary | ICD-10-CM | POA: Diagnosis not present

## 2021-12-12 MED ORDER — LIDOCAINE 5 % EX PTCH: 1.0000 | MEDICATED_PATCH | CUTANEOUS | 0 refills | Status: DC

## 2021-12-12 MED ORDER — CYCLOBENZAPRINE HCL 10 MG PO TABS
10.0000 mg | ORAL_TABLET | Freq: Once | ORAL | Status: AC
  Administered 2021-12-12: 10 mg via ORAL
  Filled 2021-12-12: qty 1

## 2021-12-12 MED ORDER — KETOROLAC TROMETHAMINE 60 MG/2ML IM SOLN
30.0000 mg | Freq: Once | INTRAMUSCULAR | Status: AC
  Administered 2021-12-12: 30 mg via INTRAMUSCULAR
  Filled 2021-12-12: qty 2

## 2021-12-12 MED ORDER — METHOCARBAMOL 500 MG PO TABS: 500.0000 mg | ORAL_TABLET | Freq: Four times a day (QID) | ORAL | 0 refills | Status: DC | PRN

## 2021-12-12 MED ORDER — NAPROXEN 250 MG PO TABS
375.0000 mg | ORAL_TABLET | Freq: Once | ORAL | Status: AC
  Administered 2021-12-12: 375 mg via ORAL
  Filled 2021-12-12: qty 2

## 2021-12-12 MED ORDER — LIDOCAINE 5 % EX PTCH
1.0000 | MEDICATED_PATCH | CUTANEOUS | Status: DC
  Administered 2021-12-12: 1 via TRANSDERMAL
  Filled 2021-12-12: qty 1

## 2021-12-12 NOTE — ED Notes (Signed)
Pt A&OX4 ambulatory at d/c with independent steady gait. Pt verbalized understanding of d/c instructions, prescriptions and follow up care. 

## 2021-12-12 NOTE — ED Triage Notes (Signed)
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 He states he fell to the ground and was having to crawl on all fours. Denies injuries from falling down.

## 2021-12-12 NOTE — ED Provider Triage Note (Signed)
Emergency Medicine Provider Triage Evaluation Note  Todd Holloway , a 26 y.o. male  was evaluated in triage.  Pt complains of low back pain.  Patient works as a patient transporter here in the hospital.  Patient reports that he had lumbar strain back in February, was being seen at physical therapy.  Patient reports that his symptoms had alleviated however today had sudden onset low back pain along with a "pop".  Patient denies any red flag symptoms of low back pain.  Patient has 5 out of 5 strength bilateral lower extremities.  Patient does have extreme tenderness to centralized lumbar spine.  Review of Systems  Positive:  Negative:   Physical Exam  BP 115/73   Pulse 80   Temp 97.8 F (36.6 C) (Oral)   Resp 18   Ht 6\' 3"  (1.905 m)   Wt 90.7 kg   SpO2 100%   BMI 25.00 kg/m  Gen:   Awake, no distress   Resp:  Normal effort  MSK:   Moves extremities without difficulty  Other:    Medical Decision Making  Medically screening exam initiated at 4:10 PM.  Appropriate orders placed.  Todd Holloway was informed that the remainder of the evaluation will be completed by another provider, this initial triage assessment does not replace that evaluation, and the importance of remaining in the ED until their evaluation is complete.     Todd Cecil, PA-C 12/12/21 1611

## 2021-12-12 NOTE — Discharge Instructions (Signed)
Continue ibuprofen and Tylenol.  You can take these every 6 hours.  You can alternate them for better pain control.  Additionally, a prescription was sent to your pharmacy for a muscle relaxer and lidocaine patches.  Continue movement within the limits of your pain.  Avoid activities that severely worsen your pain.  Return to the emergency department for any new or worsening symptoms of concern.

## 2021-12-12 NOTE — ED Notes (Signed)
Patient transported to MRI 

## 2021-12-12 NOTE — ED Provider Notes (Signed)
[facility] EMERGENCY DEPARTMENT Provider Note   CSN: [id number] Arrival date & time: 12/12/21  1555     History  Chief Complaint  Patient presents with   Back Pain    Todd Holloway is a 26 y.o. male.   Back Pain Associated symptoms: numbness and weakness   Patient presents for low back pain.  Medical history includes asthma.  He works in Electronics engineer.  He has had prior lumbar strain earlier this year.  Today he experienced a "pop" in his lower back and has since had pain in this area.  He was given Flexeril and naproxen in ED triage.  He has since had mild improvement in symptoms.  He does endorse left leg weakness and numbness.     Home Medications Prior to Admission medications   Medication Sig Start Date End Date Taking? Authorizing Provider  methocarbamol (ROBAXIN) 500 MG tablet Take 1 tablet (500 mg total) by mouth every 6 (six) hours as needed for muscle spasms. 12/12/21  Yes Gloris Manchester, MD  albuterol (PROVENTIL HFA;VENTOLIN HFA) 108 (90 Base) MCG/ACT inhaler Inhale 1-2 puffs into the lungs every 6 (six) hours as needed for wheezing or shortness of breath. Patient not taking: Reported on 09/27/2021 03/11/18   Wieters, Hallie C, PA-C  lidocaine (LIDODERM) 5 % Place 1 patch onto the skin daily. Remove & Discard patch within 12 hours or as directed by MD 12/12/21   Gloris Manchester, MD  metroNIDAZOLE (FLAGYL) 500 MG tablet Take 1 tablet (500 mg total) by mouth 2 (two) times daily. 11/05/21   Lamptey, Britta Mccreedy, MD  naproxen (NAPROSYN) 500 MG tablet Take 1 tablet (500 mg total) by mouth 2 (two) times daily. Patient not taking: Reported on 09/27/2021 03/19/21   Redwine, Madison A, PA-C  ondansetron (ZOFRAN ODT) 8 MG disintegrating tablet Take 1 tablet (8 mg total) by mouth every 8 (eight) hours as needed for nausea or vomiting. Patient not taking: Reported on 09/27/2021 11/21/20   Rhys Martini, PA-C      Allergies    Patient has no known allergies.    Review  of Systems   Review of Systems  Musculoskeletal:  Positive for back pain.  Neurological:  Positive for weakness and numbness.  All other systems reviewed and are negative.   Physical Exam Updated Vital Signs BP 121/78 (BP Location: Right Arm)   Pulse 77   Temp 97.8 F (36.6 C) (Oral)   Resp 16   Ht 6\' 3"  (1.905 m)   Wt 90.7 kg   SpO2 99%   BMI 25.00 kg/m  Physical Exam Vitals and nursing note reviewed.  Constitutional:      General: He is not in acute distress.    Appearance: Normal appearance. He is well-developed. He is not ill-appearing, toxic-appearing or diaphoretic.  HENT:     Head: Normocephalic and atraumatic.     Right Ear: External ear normal.     Left Ear: External ear normal.     Nose: Nose normal.     Mouth/Throat:     Mouth: Mucous membranes are moist.     Pharynx: Oropharynx is clear.  Eyes:     Extraocular Movements: Extraocular movements intact.     Conjunctiva/sclera: Conjunctivae normal.  Cardiovascular:     Rate and Rhythm: Normal rate and regular rhythm.  Pulmonary:     Effort: Pulmonary effort is normal. No respiratory distress.  Abdominal:     General: There is no distension.  Palpations: Abdomen is soft.     Tenderness: There is no abdominal tenderness.  Musculoskeletal:        General: Tenderness (L5) present. No swelling.     Cervical back: Normal range of motion and neck supple.     Right lower leg: No edema.     Left lower leg: No edema.  Skin:    General: Skin is warm and dry.     Capillary Refill: Capillary refill takes less than 2 seconds.     Coloration: Skin is not jaundiced or pale.  Neurological:     Mental Status: He is alert and oriented to person, place, and time.     Cranial Nerves: No cranial nerve deficit.     Sensory: Sensory deficit (Diminished sensation in LLE) present.     Motor: Weakness (DKA strength with knee extension and plantarflexion in LLE) present.  Psychiatric:        Mood and Affect: Mood normal.         Behavior: Behavior normal.        Thought Content: Thought content normal.        Judgment: Judgment normal.     ED Results / Procedures / Treatments   Labs (all labs ordered are listed, but only abnormal results are displayed) Labs Reviewed - No data to display  EKG None  Radiology MR LUMBAR SPINE WO CONTRAST  Result Date: 12/12/2021 CLINICAL DATA:  Back pain. EXAM: MRI LUMBAR SPINE WITHOUT CONTRAST TECHNIQUE: Multiplanar, multisequence MR imaging of the lumbar spine was performed. No intravenous contrast was administered. COMPARISON:  CT scan, same date. FINDINGS: Segmentation: There are five lumbar type vertebral bodies. The last full intervertebral disc space is labeled L5-S1. This correlates with the CT scan. Alignment:  Normal Vertebrae:  Normal marrow signal. No bone lesions or fractures. Conus medullaris and cauda equina: Conus extends to the T11-12 level. Conus and cauda equina appear normal. Paraspinal and other soft tissues: No significant paraspinal or retroperitoneal findings. Disc levels: No lumbar disc protrusions, spinal or foraminal stenosis. The facets are normally aligned. No pars defects. Small scattered synovial cysts but these are directed posteriorly. No canal involvement. IMPRESSION: Unremarkable lumbar spine MRI examination. Electronically Signed   By: Rudie Meyer M.D.   On: 12/12/2021 19:50   CT Lumbar Spine Wo Contrast  Result Date: 12/12/2021 CLINICAL DATA:  Initial evaluation for acute lower back pain. EXAM: CT LUMBAR SPINE WITHOUT CONTRAST TECHNIQUE: Multidetector CT imaging of the lumbar spine was performed without intravenous contrast administration. Multiplanar CT image reconstructions were also generated. RADIATION DOSE REDUCTION: This exam was performed according to the departmental dose-optimization program which includes automated exposure control, adjustment of the mA and/or kV according to patient size and/or use of iterative reconstruction  technique. COMPARISON:  Prior radiograph from 03/13/2021. FINDINGS: Segmentation: Standard. Lowest well-formed disc space labeled the L5-S1 level. Alignment: Trace degenerative retrolisthesis of L5 on S1. Alignment otherwise normal with preservation of the normal lumbar lordosis. Vertebrae: Vertebral body height maintained without acute or chronic fracture. Visualized sacrum and pelvis intact. SI joints symmetric and within normal limits. No worrisome osseous lesions. Paraspinal and other soft tissues: Unremarkable. Disc levels: L1-2:  Unremarkable. L2-3:  Unremarkable. L3-4:  Unremarkable. L4-5:  Unremarkable. L5-S1: Mild disc bulge. Superimposed small central disc protrusion closely approximates the descending S1 nerve roots as they course through the lateral recesses, slightly greater on the left (series 5, image 122). No frank neural impingement. No significant canal or lateral recess stenosis. Foramina remain  patent. IMPRESSION: 1. No acute osseous abnormality within the lumbar spine. 2. Small central disc protrusion at L5-S1, closely approximating and potentially affecting/irritating either of the descending S1 nerve roots, slightly greater on the left. Electronically Signed   By: Jeannine Boga M.D.   On: 12/12/2021 18:44    Procedures Procedures    Medications Ordered in ED Medications  lidocaine (LIDODERM) 5 % 1 patch (1 patch Transdermal Patch Applied 12/12/21 1835)  cyclobenzaprine (FLEXERIL) tablet 10 mg (10 mg Oral Given 12/12/21 1634)  naproxen (NAPROSYN) tablet 375 mg (375 mg Oral Given 12/12/21 1633)  ketorolac (TORADOL) injection 30 mg (30 mg Intramuscular Given 12/12/21 2121)    ED Course/ Medical Decision Making/ A&P                           Medical Decision Making Amount and/or Complexity of Data Reviewed Radiology: ordered.  Risk Prescription drug management.   This patient presents to the ED for concern of low back pain, this involves an extensive number of treatment  options, and is a complaint that carries with it a high risk of complications and morbidity.  The differential diagnosis includes muscle strain, herniated disc, spinal cord compression   Co morbidities that complicate the patient evaluation  Asthma   Additional history obtained:  Additional history obtained from N/A External records from outside source obtained and reviewed including EMR   Imaging Studies ordered:  I ordered imaging studies including CT and MRI of lumbar spine I independently visualized and interpreted imaging which showed small central disc protrusion at L5-S1 identified on CT scan.  No acute findings on MRI. I agree with the radiologist interpretation   Cardiac Monitoring: / EKG:  The patient was maintained on a cardiac monitor.  I personally viewed and interpreted the cardiac monitored which showed an underlying rhythm of: Sinus rhythm   Problem List / ED Course / Critical interventions / Medication management  Patient is a 26 year old male presents for low back pain.  Onset was today.  At the time, he was pushing a stretcher.  He felt a pop in his lower back and has since had pain in the area of L5.  He did have some improvement following dose of Flexeril and naproxen in ED triage.  On exam, patient has tenderness that is localized to L5 area.  Pain is worsened with left leg raise.  He does have radiation down his left leg.  He does endorse diminished sensation in his left lower leg.  He does have very mild weakness with plantarflexion of his foot and extension of his knee on his left leg.  Given concern of neurologic deficits, patient to undergo MRI.  Lidocaine patch was provided for further analgesia.  Patient underwent MRI which was reassuring.  He was advised to continue multimodal pain control at home.  Robaxin was prescribed.  He was discharged in good condition. I ordered medication including Toradol, lidocaine patch for analgesia Reevaluation of the patient  after these medicines showed that the patient improved I have reviewed the patients home medicines and have made adjustments as needed   Social Determinants of Health:  Does not have PCP        Final Clinical Impression(s) / ED Diagnoses Final diagnoses:  Acute midline low back pain with left-sided sciatica    Rx / DC Orders ED Discharge Orders          Ordered    methocarbamol (ROBAXIN) 500 MG  tablet  Every 6 hours PRN        12/12/21 2129    lidocaine (LIDODERM) 5 %  Every 24 hours        12/12/21 2130              Gloris Manchester, MD 12/12/21 2131

## 2022-01-19 ENCOUNTER — Other Ambulatory Visit: Payer: Self-pay

## 2022-01-19 ENCOUNTER — Emergency Department (HOSPITAL_COMMUNITY)
Admission: EM | Admit: 2022-01-19 | Discharge: 2022-01-19 | Disposition: A | Discharge status: Home / Self Care | Payer: Self-pay | Attending: Emergency Medicine | Admitting: Emergency Medicine

## 2022-01-19 ENCOUNTER — Emergency Department (HOSPITAL_COMMUNITY): Payer: Self-pay

## 2022-01-19 ENCOUNTER — Encounter (HOSPITAL_COMMUNITY): Payer: Self-pay | Admitting: Emergency Medicine

## 2022-01-19 DIAGNOSIS — J45909 Unspecified asthma, uncomplicated: Secondary | ICD-10-CM | POA: Insufficient documentation

## 2022-01-19 DIAGNOSIS — R1033 Periumbilical pain: Secondary | ICD-10-CM | POA: Insufficient documentation

## 2022-01-19 DIAGNOSIS — R531 Weakness: Secondary | ICD-10-CM | POA: Insufficient documentation

## 2022-01-19 DIAGNOSIS — R519 Headache, unspecified: Secondary | ICD-10-CM | POA: Insufficient documentation

## 2022-01-19 DIAGNOSIS — R112 Nausea with vomiting, unspecified: Secondary | ICD-10-CM | POA: Insufficient documentation

## 2022-01-19 DIAGNOSIS — R55 Syncope and collapse: Secondary | ICD-10-CM | POA: Insufficient documentation

## 2022-01-19 DIAGNOSIS — M542 Cervicalgia: Secondary | ICD-10-CM | POA: Insufficient documentation

## 2022-01-19 DIAGNOSIS — R197 Diarrhea, unspecified: Secondary | ICD-10-CM | POA: Insufficient documentation

## 2022-01-19 LAB — CBC WITH DIFFERENTIAL/PLATELET
Abs Immature Granulocytes: 0.02 10*3/uL (ref 0.00–0.07)
Basophils Absolute: 0 10*3/uL (ref 0.0–0.1)
Basophils Relative: 1 %
Eosinophils Absolute: 0.2 10*3/uL (ref 0.0–0.5)
Eosinophils Relative: 3 %
HCT: 44 % (ref 39.0–52.0)
Hemoglobin: 15.8 g/dL (ref 13.0–17.0)
Immature Granulocytes: 0 %
Lymphocytes Relative: 28 %
Lymphs Abs: 1.6 10*3/uL (ref 0.7–4.0)
MCH: 31.7 pg (ref 26.0–34.0)
MCHC: 35.9 g/dL (ref 30.0–36.0)
MCV: 88.4 fL (ref 80.0–100.0)
Monocytes Absolute: 0.5 10*3/uL (ref 0.1–1.0)
Monocytes Relative: 8 %
Neutro Abs: 3.4 10*3/uL (ref 1.7–7.7)
Neutrophils Relative %: 60 %
Platelets: 230 10*3/uL (ref 150–400)
RBC: 4.98 MIL/uL (ref 4.22–5.81)
RDW: 11.8 % (ref 11.5–15.5)
WBC: 5.7 10*3/uL (ref 4.0–10.5)
nRBC: 0 % (ref 0.0–0.2)

## 2022-01-19 LAB — COMPREHENSIVE METABOLIC PANEL
ALT: 17 U/L (ref 0–44)
AST: 24 U/L (ref 15–41)
Albumin: 4.1 g/dL (ref 3.5–5.0)
Alkaline Phosphatase: 66 U/L (ref 38–126)
Anion gap: 11 (ref 5–15)
BUN: 11 mg/dL (ref 6–20)
CO2: 26 mmol/L (ref 22–32)
Calcium: 9.6 mg/dL (ref 8.9–10.3)
Chloride: 101 mmol/L (ref 98–111)
Creatinine, Ser: 1.16 mg/dL (ref 0.61–1.24)
GFR, Estimated: >60 mL/min (ref >60)
Glucose, Bld: 98 mg/dL (ref 70–99)
Potassium: 3.4 mmol/L — ABNORMAL LOW (ref 3.5–5.1)
Sodium: 138 mmol/L (ref 135–145)
Total Bilirubin: 0.7 mg/dL (ref 0.3–1.2)
Total Protein: 7.5 g/dL (ref 6.5–8.1)

## 2022-01-19 LAB — MAGNESIUM: Magnesium: 1.8 mg/dL (ref 1.7–2.4)

## 2022-01-19 LAB — LIPASE, BLOOD: Lipase: 33 U/L (ref 11–51)

## 2022-01-19 LAB — CBG MONITORING, ED: Glucose-Capillary: 99 mg/dL (ref 70–99)

## 2022-01-19 MED ORDER — ONDANSETRON HCL 4 MG/2ML IJ SOLN
4.0000 mg | Freq: Once | INTRAMUSCULAR | Status: AC
  Administered 2022-01-19: 4 mg via INTRAVENOUS
  Filled 2022-01-19: qty 2

## 2022-01-19 MED ORDER — LIDOCAINE 5 % EX PTCH
1.0000 | MEDICATED_PATCH | CUTANEOUS | Status: DC
  Administered 2022-01-19: 1 via TRANSDERMAL
  Filled 2022-01-19: qty 1

## 2022-01-19 MED ORDER — ACETAMINOPHEN 325 MG PO TABS
650.0000 mg | ORAL_TABLET | Freq: Once | ORAL | Status: AC
  Administered 2022-01-19: 650 mg via ORAL
  Filled 2022-01-19: qty 2

## 2022-01-19 MED ORDER — LACTATED RINGERS IV BOLUS
2000.0000 mL | Freq: Once | INTRAVENOUS | Status: AC
  Administered 2022-01-19: 2000 mL via INTRAVENOUS

## 2022-01-19 MED ORDER — IOHEXOL 350 MG/ML SOLN
75.0000 mL | Freq: Once | INTRAVENOUS | Status: AC | PRN
  Administered 2022-01-19: 75 mL via INTRAVENOUS

## 2022-01-19 MED ORDER — ONDANSETRON 4 MG PO TBDP: 4.0000 mg | ORAL_TABLET | Freq: Three times a day (TID) | ORAL | 0 refills | Status: DC | PRN

## 2022-01-19 NOTE — ED Triage Notes (Signed)
Patient BIB [facility] from home post syncopal episode. Patient c/o 2-3 diarrhea, nausea/ vomiting, cough, headache. Patient states while walking he had a syncopal episode, fell and hit and head. Witnessed by his partner at home. Patient Aox4. All VSS.

## 2022-01-19 NOTE — ED Notes (Signed)
Pt to CT

## 2022-01-19 NOTE — Discharge Instructions (Addendum)
Ensure to stay well-hydrated.  If you have any more fluid losses in the form of vomiting or diarrhea, it is important to replace that fluid by eating and drinking.  A medication was sent to your pharmacy to treat nausea.  Take this only as needed.  Return to the emergency department for any new or worsening symptoms of concern.

## 2022-01-19 NOTE — ED Provider Notes (Signed)
[facility] EMERGENCY DEPARTMENT Provider Note   CSN: [id number] Arrival date & time: 01/19/22  1138     History  Chief Complaint  Patient presents with   Loss of Consciousness    Todd Holloway is a 26 y.o. male.  HPI Patient presents after syncopal episode.  Medical history includes asthma.  Patient reports that he has had watery diarrhea for the past 2 days.  Episodes of this are too numerous to count.  He has also had loss of appetite and very limited p.o. intake.  This morning, he had 3 episodes of vomiting.  He has had abdominal pain, most notably in the periumbilical region.  Prior to arrival, he was getting his son ready at home.  While on the porch, he felt a prodrome of dizziness prior to passing out.  When he fell, he did strike his head.  He does currently have headache and neck pain.  He has ongoing fatigue, generalized weakness, and abdominal pain.  When he came to, people standing around him.  He was brought to the ED via EMS.  Patient states that he had 1 episode of syncope in the past.  This was in the setting of low blood sugar.  He does not have any history of diabetes and does not take any blood sugar lowering medications.    Home Medications Prior to Admission medications   Medication Sig Start Date End Date Taking? Authorizing Provider  lidocaine (LIDODERM) 5 % Place 1 patch onto the skin daily. Remove & Discard patch within 12 hours or as directed by MD 12/12/21  Yes Godfrey Pick, MD  methocarbamol (ROBAXIN) 500 MG tablet Take 1 tablet (500 mg total) by mouth every 6 (six) hours as needed for muscle spasms. 12/12/21  Yes Godfrey Pick, MD  naproxen (NAPROSYN) 500 MG tablet Take 1 tablet (500 mg total) by mouth 2 (two) times daily. 03/19/21  Yes Redwine, Madison A, PA-C  ondansetron (ZOFRAN-ODT) 4 MG disintegrating tablet Take 1 tablet (4 mg total) by mouth every 8 (eight) hours as needed for nausea or vomiting. 01/19/22  Yes Godfrey Pick, MD   metroNIDAZOLE (FLAGYL) 500 MG tablet Take 1 tablet (500 mg total) by mouth 2 (two) times daily. Patient not taking: Reported on 01/19/2022 11/05/21   Chase Picket, MD      Allergies    Patient has no known allergies.    Review of Systems   Review of Systems  Constitutional:  Positive for appetite change and fatigue.  Gastrointestinal:  Positive for abdominal pain, diarrhea, nausea and vomiting.  Musculoskeletal:  Positive for back pain (Chronic) and neck pain.  Neurological:  Positive for dizziness, syncope, weakness (Generalized) and headaches.  All other systems reviewed and are negative.   Physical Exam Updated Vital Signs BP 116/74   Pulse 73   Temp 98 F (36.7 C) (Oral)   Resp 15   Ht 6\' 3"  (1.905 m)   Wt 83.9 kg   SpO2 98%   BMI 23.12 kg/m  Physical Exam Vitals and nursing note reviewed.  Constitutional:      General: He is not in acute distress.    Appearance: Normal appearance. He is well-developed and normal weight. He is not ill-appearing, toxic-appearing or diaphoretic.  HENT:     Head: Normocephalic and atraumatic.     Right Ear: External ear normal.     Left Ear: External ear normal.     Nose: Nose normal.     Mouth/Throat:  Mouth: Mucous membranes are moist.  Eyes:     Extraocular Movements: Extraocular movements intact.     Conjunctiva/sclera: Conjunctivae normal.  Cardiovascular:     Rate and Rhythm: Normal rate and regular rhythm.     Heart sounds: No murmur heard. Pulmonary:     Effort: Pulmonary effort is normal. No respiratory distress.     Breath sounds: Normal breath sounds. No wheezing or rales.  Chest:     Chest wall: No tenderness.  Abdominal:     General: There is no distension.     Palpations: Abdomen is soft.     Tenderness: There is abdominal tenderness. There is no guarding or rebound.  Musculoskeletal:        General: No swelling. Normal range of motion.     Cervical back: Normal range of motion and neck supple.  Tenderness present. No rigidity.     Right lower leg: No edema.     Left lower leg: No edema.  Skin:    General: Skin is warm.     Capillary Refill: Capillary refill takes less than 2 seconds.     Coloration: Skin is not jaundiced or pale.  Neurological:     General: No focal deficit present.     Mental Status: He is alert and oriented to person, place, and time.     Cranial Nerves: No cranial nerve deficit.     Sensory: No sensory deficit.     Motor: No weakness.     Coordination: Coordination normal.  Psychiatric:        Mood and Affect: Mood normal.        Behavior: Behavior normal.        Thought Content: Thought content normal.        Judgment: Judgment normal.     ED Results / Procedures / Treatments   Labs (all labs ordered are listed, but only abnormal results are displayed) Labs Reviewed  COMPREHENSIVE METABOLIC PANEL - Abnormal; Notable for the following components:      Result Value   Potassium 3.4 (*)    All other components within normal limits  CBC WITH DIFFERENTIAL/PLATELET  MAGNESIUM  LIPASE, BLOOD  URINALYSIS, ROUTINE W REFLEX MICROSCOPIC  CBG MONITORING, ED    EKG EKG Interpretation  Date/Time:  Saturday January 19 2022 12:25:51 EST Ventricular Rate:  73 PR Interval:  146 QRS Duration: 75 QT Interval:  374 QTC Calculation: 413 R Axis:   74 Text Interpretation: Sinus rhythm Confirmed by Gloris Manchester ([id number]) on 01/19/2022 12:45:05 PM  Radiology CT CERVICAL SPINE WO CONTRAST  Result Date: 01/19/2022 CLINICAL DATA:  Syncope, fall EXAM: CT CERVICAL SPINE WITHOUT CONTRAST TECHNIQUE: Multidetector CT imaging of the cervical spine was performed without intravenous contrast. Multiplanar CT image reconstructions were also generated. RADIATION DOSE REDUCTION: This exam was performed according to the departmental dose-optimization program which includes automated exposure control, adjustment of the mA and/or kV according to patient size and/or use of iterative  reconstruction technique. COMPARISON:  06/17/2020 FINDINGS: Alignment: Alignment of posterior margins of vertebral bodies appears normal. Skull base and vertebrae: No recent fracture is seen. Soft tissues and spinal canal: There is no central spinal stenosis. Disc levels: There is no significant encroachment of neural foramina. Upper chest: Unremarkable. Other: Root abscesses are noted in right side of mandible. Defects are seen in the crowns of multiple maxillary and mandibular teeth suggesting significant dental disease. IMPRESSION: No recent fracture is seen in cervical spine. There is no spinal stenosis or encroachment  of neural foramina. Significant dental disease. Electronically Signed   By: Elmer Picker M.D.   On: 01/19/2022 14:32   CT HEAD WO CONTRAST  Result Date: 01/19/2022 CLINICAL DATA:  Syncope, fall, trauma EXAM: CT HEAD WITHOUT CONTRAST TECHNIQUE: Contiguous axial images were obtained from the base of the skull through the vertex without intravenous contrast. RADIATION DOSE REDUCTION: This exam was performed according to the departmental dose-optimization program which includes automated exposure control, adjustment of the mA and/or kV according to patient size and/or use of iterative reconstruction technique. COMPARISON:  06/17/2020 FINDINGS: Brain: No acute intracranial findings are seen. There are no signs of bleeding within the cranium. Ventricles are not dilated. There is no focal edema or mass effect. Vascular: Unremarkable. Skull: Unremarkable. Sinuses/Orbits: There is mucosal thickening in ethmoid sinus. Other: None. IMPRESSION: No acute intracranial findings are seen in noncontrast CT brain. Electronically Signed   By: Elmer Picker M.D.   On: 01/19/2022 14:28   CT ABDOMEN PELVIS W CONTRAST  Result Date: 01/19/2022 CLINICAL DATA:  Syncope, fall, abdominal pain, nausea, vomiting EXAM: CT ABDOMEN AND PELVIS WITH CONTRAST TECHNIQUE: Multidetector CT imaging of the abdomen  and pelvis was performed using the standard protocol following bolus administration of intravenous contrast. RADIATION DOSE REDUCTION: This exam was performed according to the departmental dose-optimization program which includes automated exposure control, adjustment of the mA and/or kV according to patient size and/or use of iterative reconstruction technique. CONTRAST:  42mL OMNIPAQUE IOHEXOL 350 MG/ML SOLN COMPARISON:  04/04/2018 FINDINGS: Lower chest: Unremarkable. Hepatobiliary: There is 5 mm low-density in the lateral aspect of right lobe, possibly a cyst or hemangioma. There is no dilation of bile ducts. Gallbladder is unremarkable. Pancreas: Unremarkable. Spleen: Unremarkable. Adrenals/Urinary Tract: Adrenals are unremarkable. There is no hydronephrosis. There is 2 mm calcific density in the lower pole of right kidney. There are no calcific densities in the courses of the ureters. Urinary bladder is unremarkable. Stomach/Bowel: Stomach is unremarkable. Small bowel loops are not dilated. Appendix is not distinctly seen. There is no pericecal stranding. There is no significant wall thickening in colon. Vascular/Lymphatic: Unremarkable. Reproductive: Unremarkable. Other: There is no ascites or pneumoperitoneum. Musculoskeletal: There is minimal retrolisthesis at L5-S1 level. No acute findings are seen. IMPRESSION: No acute findings are seen in CT scan of abdomen and pelvis. 2 mm right renal calculus. Other findings as described in the body of the report. Electronically Signed   By: Elmer Picker M.D.   On: 01/19/2022 14:26    Procedures Procedures    Medications Ordered in ED Medications  lidocaine (LIDODERM) 5 % 1 patch (1 patch Transdermal Patch Applied 01/19/22 1444)  lactated ringers bolus 2,000 mL (0 mLs Intravenous Stopped 01/19/22 1446)  ondansetron (ZOFRAN) injection 4 mg (4 mg Intravenous Given 01/19/22 1211)  acetaminophen (TYLENOL) tablet 650 mg (650 mg Oral Given 01/19/22 1212)   iohexol (OMNIPAQUE) 350 MG/ML injection 75 mL (75 mLs Intravenous Contrast Given 01/19/22 1415)    ED Course/ Medical Decision Making/ A&P Clinical Course as of 01/19/22 1540  Sat Jan 19, 2022  1539 CT CERVICAL SPINE WO CONTRAST [RD]    Clinical Course User Index [RD] Godfrey Pick, MD                           Medical Decision Making Amount and/or Complexity of Data Reviewed Labs: ordered. Radiology: ordered. ECG/medicine tests: ordered.  Risk OTC drugs. Prescription drug management.   This patient presents to the ED  for concern of syncope, this involves an extensive number of treatment options, and is a complaint that carries with it a high risk of complications and morbidity.  The differential diagnosis includes dehydration, arrhythmia, vasovagal episode, anemia   Co morbidities that complicate the patient evaluation  Asthma, chronic low back pain   Additional history obtained:  Additional history obtained from N/A External records from outside source obtained and reviewed including EMR   Lab Tests:  I Ordered, and personally interpreted labs.  The pertinent results include: Normal hemoglobin, no leukocytosis, normal kidney function, normal electrolytes   Imaging Studies ordered:  I ordered imaging studies including CT of head, c-spine, abdomen, pelvis I independently visualized and interpreted imaging which showed acute findings I agree with the radiologist interpretation   Cardiac Monitoring: / EKG:  The patient was maintained on a cardiac monitor.  I personally viewed and interpreted the cardiac monitored which showed an underlying rhythm of: sinus rhythm   Problem List / ED Course / Critical interventions / Medication management  Patient presents for syncope.  This occurred shortly prior to arrival.  During the syncopal episode, he did fall and struck his head.  He has headache and neck pain.  Patient reports 2-day history of nausea, diarrhea, and poor  p.o. intake.  He had 3 episodes of vomiting today.  On arrival in the ED, patient is awake, alert, and oriented.  He has no focal neurologic deficits.  No cardiac rubs or murmurs are appreciated.  Twelve-lead, obtained by EMS, shows normal sinus rhythm.  Vital signs are normal on arrival.  Blood sugar is 99.  Patient does have tenderness to C-spine.  He also has generalized abdominal tenderness.  Tylenol was given for headache.  IV fluids were given for dehydration.  Diagnostic workup was initiated.  Patient had improved symptoms following IV fluids.  He was able to eat and drink while in the ED.  A lidocaine patch was ordered for his chronic low back pain.  Laboratory workup showed normal results.  On CT imaging of chest, cervical spine, abdomen, and pelvis, no acute findings were identified.  I suspect that patient's episode was secondary to dehydration from recent fluid losses.  He was advised to stay well-hydrated and replace any future fluid losses.  Zofran was prescribed to take as needed.  He was discharged in good condition. I ordered medication including IV fluids for duration; Zofran for nausea; Tylenol for headache; lidocaine for chronic back pain Reevaluation of the patient after these medicines showed that the patient resolved I have reviewed the patients home medicines and have made adjustments as needed   Social Determinants of Health:  Does not have a PCP        Final Clinical Impression(s) / ED Diagnoses Final diagnoses:  Syncope and collapse    Rx / DC Orders ED Discharge Orders          Ordered    ondansetron (ZOFRAN-ODT) 4 MG disintegrating tablet  Every 8 hours PRN        01/19/22 1537              Godfrey Pick, MD 01/19/22 1540

## 2022-01-20 ENCOUNTER — Ambulatory Visit (HOSPITAL_COMMUNITY)
Admission: EM | Admit: 2022-01-20 | Discharge: 2022-01-20 | Discharge status: Against Medical Advice | Payer: Medicaid Other

## 2022-01-20 NOTE — ED Notes (Signed)
Patient called twice for triage with no response.

## 2022-01-20 NOTE — ED Notes (Signed)
Patient called by other for triage with no response.

## 2022-01-21 ENCOUNTER — Other Ambulatory Visit: Payer: Self-pay

## 2022-01-21 ENCOUNTER — Encounter (HOSPITAL_COMMUNITY): Payer: Self-pay | Admitting: *Deleted

## 2022-01-21 ENCOUNTER — Ambulatory Visit (HOSPITAL_COMMUNITY)
Admission: EM | Admit: 2022-01-21 | Discharge: 2022-01-21 | Disposition: A | Discharge status: Home / Self Care | Payer: Self-pay | Attending: Emergency Medicine | Admitting: Emergency Medicine

## 2022-01-21 DIAGNOSIS — K529 Noninfective gastroenteritis and colitis, unspecified: Secondary | ICD-10-CM | POA: Insufficient documentation

## 2022-01-21 DIAGNOSIS — Z1152 Encounter for screening for COVID-19: Secondary | ICD-10-CM | POA: Insufficient documentation

## 2022-01-21 DIAGNOSIS — Z79899 Other long term (current) drug therapy: Secondary | ICD-10-CM | POA: Insufficient documentation

## 2022-01-21 LAB — RESP PANEL BY RT-PCR (FLU A&B, COVID) ARPGX2
Influenza A by PCR: NEGATIVE
Influenza B by PCR: NEGATIVE
SARS Coronavirus 2 by RT PCR: NEGATIVE

## 2022-01-21 LAB — CBG MONITORING, ED: Glucose-Capillary: 101 mg/dL — ABNORMAL HIGH (ref 70–99)

## 2022-01-21 MED ORDER — ONDANSETRON 4 MG PO TBDP: 4.0000 mg | ORAL_TABLET | Freq: Four times a day (QID) | ORAL | 0 refills | Status: DC | PRN

## 2022-01-21 NOTE — ED Triage Notes (Signed)
Pt seen in ED for same on 01-19-22. Pt reports he fainted over weekend and has had vomiting,diarrhea the patient has poor appetite. Pt reports diarrhea x3 in last 24 hrs. Pt reports vomiting x2 over the last 24.

## 2022-01-21 NOTE — Discharge Instructions (Signed)
Make sure you are drinking LOTS of fluids to replenish what's been lost. Continue Gatorade and Pedialyte for electrolytes.   Try a very bland diet over the next few days to work back into normal appetite. Saltines, plain toast, soups, etc  We will call you if your covid/flu test returns positive.

## 2022-01-21 NOTE — ED Provider Notes (Signed)
[facility]    CSN: [id number] Arrival date & time: 01/21/22  1510     History   Chief Complaint Chief Complaint  Patient presents with   Emesis   Diarrhea    HPI Todd Holloway is a 26 y.o. male.  Presents with 5-day history of nausea, vomiting, diarrhea.  Reports he fainted two days ago and went to the emergency department.  They thought it was due to fluid loss and dehydration, low blood sugar. He has not fainted since then.  Reports vomiting twice this morning.  None yesterday. 3 episodes of watery diarrhea in the last 24 hours. He has tried to eat meals but they make him nauseous Has been drinking lots of fluids since his ED visit including Gatorade and Pedialyte Has tried zofran occasionally. Last dose 12 hours ago  Denies severe abdominal pain.  No fevers. No blood in stool Denies recent travel or abx use  Past Medical History:  Diagnosis Date   Asthma     Patient Active Problem List   Diagnosis Date Noted   HSV (herpes simplex virus) anogenital infection 06/11/2016    History reviewed. No pertinent surgical history.   Home Medications    Prior to Admission medications   Medication Sig Start Date End Date Taking? Authorizing Provider  lidocaine (LIDODERM) 5 % Place 1 patch onto the skin daily. Remove & Discard patch within 12 hours or as directed by MD 12/12/21   Gloris Manchester, MD  methocarbamol (ROBAXIN) 500 MG tablet Take 1 tablet (500 mg total) by mouth every 6 (six) hours as needed for muscle spasms. 12/12/21   Gloris Manchester, MD  metroNIDAZOLE (FLAGYL) 500 MG tablet Take 1 tablet (500 mg total) by mouth 2 (two) times daily. Patient not taking: Reported on 01/19/2022 11/05/21   Merrilee Jansky, MD  naproxen (NAPROSYN) 500 MG tablet Take 1 tablet (500 mg total) by mouth 2 (two) times daily. 03/19/21   Redwine, Madison A, PA-C  ondansetron (ZOFRAN-ODT) 4 MG disintegrating tablet Take 1 tablet (4 mg total) by mouth every 6 (six) hours as needed for  nausea or vomiting. 01/21/22   Quindell Shere, Lurena Joiner, PA-C    Family History Family History  Problem Relation Age of Onset   Asthma Father     Social History Social History   Tobacco Use   Smoking status: Never   Smokeless tobacco: Never  Vaping Use   Vaping Use: Never used  Substance Use Topics   Alcohol use: No   Drug use: No     Allergies   Patient has no known allergies.   Review of Systems Review of Systems  Gastrointestinal:  Positive for diarrhea and vomiting.   Per HPI  Physical Exam Triage Vital Signs ED Triage Vitals  Enc Vitals Group     BP 01/21/22 1824 133/83     Pulse Rate 01/21/22 1824 86     Resp 01/21/22 1824 18     Temp 01/21/22 1824 98.6 F (37 C)     Temp src --      SpO2 01/21/22 1824 97 %     Weight --      Height --      Head Circumference --      Peak Flow --      Pain Score 01/21/22 1822 10     Pain Loc --      Pain Edu? --      Excl. in GC? --    No data found.  Updated Vital Signs BP 133/83   Pulse 86   Temp 98.6 F (37 C)   Resp 18   SpO2 97%   Physical Exam Vitals and nursing note reviewed.  Constitutional:      Appearance: Normal appearance.  HENT:     Mouth/Throat:     Mouth: Mucous membranes are moist.     Pharynx: Oropharynx is clear.  Eyes:     Conjunctiva/sclera: Conjunctivae normal.  Cardiovascular:     Rate and Rhythm: Normal rate and regular rhythm.     Heart sounds: Normal heart sounds.  Pulmonary:     Effort: Pulmonary effort is normal. No respiratory distress.     Breath sounds: Normal breath sounds.  Abdominal:     General: Bowel sounds are normal.     Palpations: Abdomen is soft.     Tenderness: There is no abdominal tenderness. There is no guarding or rebound.  Musculoskeletal:        General: Normal range of motion.  Skin:    General: Skin is warm and dry.  Neurological:     Mental Status: He is alert and oriented to person, place, and time.     UC Treatments / Results  Labs (all labs  ordered are listed, but only abnormal results are displayed) Labs Reviewed  CBG MONITORING, ED - Abnormal; Notable for the following components:      Result Value   Glucose-Capillary 101 (*)    All other components within normal limits  RESP PANEL BY RT-PCR (FLU A&B, COVID) [id number]    EKG  Radiology No results found.  Procedures Procedures   Medications Ordered in UC Medications - No data to display  Initial Impression / Assessment and Plan / UC Course  I have reviewed the triage vital signs and the nursing notes.  Pertinent labs & imaging results that were available during my care of the patient were reviewed by me and considered in my medical decision making (see chart for details).  CBG 101 He is requesting COVID and flu testing today. Pending. Discussed this would not change treatment plan given his duration of symptoms.  He is well-appearing with benign abdominal exam.  Appears well-hydrated.  Discussed importance of increasing fluids, can try bland diet and introducing small portions for snacks over the next few days. Can continue using Zofran as needed Return precautions discussed. Patient agrees to plan  Final Clinical Impressions(s) / UC Diagnoses   Final diagnoses:  Gastroenteritis     Discharge Instructions      Make sure you are drinking LOTS of fluids to replenish what's been lost. Continue Gatorade and Pedialyte for electrolytes.   Try a very bland diet over the next few days to work back into normal appetite. Saltines, plain toast, soups, etc  We will call you if your covid/flu test returns positive.      ED Prescriptions     Medication Sig Dispense Auth. Provider   ondansetron (ZOFRAN-ODT) 4 MG disintegrating tablet Take 1 tablet (4 mg total) by mouth every 6 (six) hours as needed for nausea or vomiting. 12 tablet Nalea Salce, Lurena Joiner, PA-C      PDMP not reviewed this encounter.   Kathrine Haddock 01/21/22 1928

## 2022-03-13 ENCOUNTER — Emergency Department (HOSPITAL_COMMUNITY)
Admission: EM | Admit: 2022-03-13 | Discharge: 2022-03-14 | Disposition: A | Discharge status: Home / Self Care | Payer: 59 | Attending: Emergency Medicine | Admitting: Emergency Medicine

## 2022-03-13 ENCOUNTER — Other Ambulatory Visit: Payer: Self-pay

## 2022-03-13 DIAGNOSIS — M545 Low back pain, unspecified: Secondary | ICD-10-CM | POA: Diagnosis present

## 2022-03-13 DIAGNOSIS — Y990 Civilian activity done for income or pay: Secondary | ICD-10-CM | POA: Insufficient documentation

## 2022-03-13 DIAGNOSIS — M5442 Lumbago with sciatica, left side: Secondary | ICD-10-CM | POA: Diagnosis not present

## 2022-03-13 DIAGNOSIS — X501XXA Overexertion from prolonged static or awkward postures, initial encounter: Secondary | ICD-10-CM | POA: Insufficient documentation

## 2022-03-13 MED ORDER — CYCLOBENZAPRINE HCL 10 MG PO TABS
5.0000 mg | ORAL_TABLET | Freq: Once | ORAL | Status: AC
  Administered 2022-03-13: 5 mg via ORAL
  Filled 2022-03-13: qty 1

## 2022-03-13 MED ORDER — DEXAMETHASONE 4 MG PO TABS
4.0000 mg | ORAL_TABLET | Freq: Once | ORAL | Status: AC
  Administered 2022-03-13: 4 mg via ORAL
  Filled 2022-03-13: qty 1

## 2022-03-13 MED ORDER — KETOROLAC TROMETHAMINE 60 MG/2ML IM SOLN
60.0000 mg | Freq: Once | INTRAMUSCULAR | Status: AC
  Administered 2022-03-13: 60 mg via INTRAMUSCULAR
  Filled 2022-03-13: qty 2

## 2022-03-13 NOTE — ED Triage Notes (Signed)
Pt c/o recurrent back pain following a fall during at work today approx 10 min ago. States he's had problems with L5 in the past.

## 2022-03-13 NOTE — ED Provider Triage Note (Signed)
Emergency Medicine Provider Triage Evaluation Note  Todd Holloway , a 27 y.o. male  was evaluated in triage.  Pt complains of sudden onset of low back pain.  He is a patient Ship broker and the hospital.  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  It is bilateral but worse on the right.  States it feels very similar to the episode that he had on 12/12/2021.  He had a CT which showed L5-S1 disc herniation and an MRI which was unremarkable.  States his right lower extremity has started to go numb which is similar presentation to his past episode  Review of Systems  Positive: As above Negative: As above  Physical Exam  BP (!) 121/93   Pulse 74   Temp 98.2 F (36.8 C) (Oral)   Resp 16   SpO2 100%  Gen:   Awake, moderate distress Resp:  Normal effort  MSK:   Moves extremities without difficulty  Other:    Medical Decision Making  Medically screening exam initiated at 8:01 PM.  Appropriate orders placed.  [name] was informed that the remainder of the evaluation will be completed by another provider, this initial triage assessment does not replace that evaluation, and the importance of remaining in the ED until their evaluation is complete.  Toradol, Flexeril and Decadron ordered in triage.   Roylene Reason, Hershal Coria 03/13/22 2002

## 2022-03-14 ENCOUNTER — Emergency Department (HOSPITAL_COMMUNITY): Payer: 59

## 2022-03-14 MED ORDER — LIDOCAINE 5 % EX PTCH
1.0000 | MEDICATED_PATCH | Freq: Once | CUTANEOUS | Status: DC
  Administered 2022-03-14: 1 via TRANSDERMAL
  Filled 2022-03-14: qty 1

## 2022-03-14 MED ORDER — METHOCARBAMOL 500 MG PO TABS: 500.0000 mg | ORAL_TABLET | Freq: Four times a day (QID) | ORAL | 0 refills | Status: DC | PRN

## 2022-03-14 MED ORDER — LIDOCAINE 5 % EX PTCH: 1.0000 | MEDICATED_PATCH | CUTANEOUS | 0 refills | Status: DC

## 2022-03-14 MED ORDER — NAPROXEN 500 MG PO TABS: 500.0000 mg | ORAL_TABLET | Freq: Two times a day (BID) | ORAL | 0 refills | Status: DC

## 2022-03-14 NOTE — ED Notes (Signed)
Patient transported to MRI 

## 2022-03-14 NOTE — ED Provider Notes (Signed)
[facility] Provider Note   CSN: [id number] Arrival date & time: 03/13/22  1953     History  Chief Complaint  Patient presents with   Back Pain    Todd Holloway is a 27 y.o. male.  Patient with no pertinent past medical history presents today with complaints of low back pain. 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 States that for a brief period he was unable to move due to pain. States that it feels similar to an episode he had on 12/12/21 when he was found to have slight disc protrusion at L5-S1 on CT imaging and then had unremarkable MRI. He did not follow-up with anyone after this episode and states that his pain improved after some time with muscle relaxers, lidocaine patches, and passive stretching.  Patient states that his symptoms are worse this time. States he feels like his left leg is numb. He is able to walk but with some difficulty. States that pain shoots down his left leg when he walks. Denies any loss of bowel or bladder function or saddle paraesthesias.   The history is provided by the patient. No language interpreter was used.  Back Pain      Home Medications Prior to Admission medications   Medication Sig Start Date End Date Taking? Authorizing Provider  lidocaine (LIDODERM) 5 % Place 1 patch onto the skin daily. Remove & Discard patch within 12 hours or as directed by MD 12/12/21   Godfrey Pick, MD  methocarbamol (ROBAXIN) 500 MG tablet Take 1 tablet (500 mg total) by mouth every 6 (six) hours as needed for muscle spasms. 12/12/21   Godfrey Pick, MD  metroNIDAZOLE (FLAGYL) 500 MG tablet Take 1 tablet (500 mg total) by mouth 2 (two) times daily. Patient not taking: Reported on 01/19/2022 11/05/21   Chase Picket, MD  naproxen (NAPROSYN) 500 MG tablet Take 1 tablet (500 mg total) by mouth 2 (two)  times daily. 03/19/21   Redwine, Madison A, PA-C  ondansetron (ZOFRAN-ODT) 4 MG disintegrating tablet Take 1 tablet (4 mg total) by mouth every 6 (six) hours as needed for nausea or vomiting. 01/21/22   Rising, Wells Guiles, PA-C      Allergies    Patient has no known allergies.    Review of Systems   Review of Systems  Musculoskeletal:  Positive for back pain.  All other systems reviewed and are negative.   Physical Exam Updated Vital Signs BP (!) 121/93   Pulse 74   Temp 98.2 F (36.8 C) (Oral)   Resp 16   SpO2 100%  Physical Exam Vitals and nursing note reviewed.  Constitutional:      General: He is not in acute distress.    Appearance: Normal appearance. He is normal weight. He is not ill-appearing, toxic-appearing or diaphoretic.  HENT:     Head: Normocephalic and atraumatic.  Cardiovascular:     Rate and Rhythm: Normal rate.  Pulmonary:     Effort: Pulmonary effort is normal. No respiratory distress.  Musculoskeletal:        General: Normal range of motion.     Cervical back: Normal range of motion.     Comments: Tenderness to palpation of lumbar spine.  No step-offs, lesions, deformity, or overlying skin changes.  No tenderness to palpation of cervical or thoracic spine.  Skin:    General: Skin is warm and dry.  Neurological:     General: No focal deficit present.     Mental Status: He is alert.     Deep Tendon Reflexes: Reflexes are normal and symmetric.     Reflex Scores:      Patellar reflexes are 2+ on the right side and 2+ on the left side.    Comments: Patient with subjective numbness to LLE. Reflexes present and equal. Patient is ambulatory with some difficulty and pain.   5/5 strength intact bilaterally. +SLR on the left.  DP and PT pulses intact and 2+.  Psychiatric:        Mood and Affect: Mood normal.        Behavior: Behavior normal.     ED Results / Procedures / Treatments   Labs (all labs ordered are listed, but only abnormal results are  displayed) Labs Reviewed - No data to display  EKG None  Radiology MR LUMBAR SPINE WO CONTRAST  Result Date: 03/14/2022 CLINICAL DATA:  Sudden onset low back pain. Cauda equina syndrome suspected EXAM: MRI LUMBAR SPINE WITHOUT CONTRAST TECHNIQUE: Multiplanar, multisequence MR imaging of the lumbar spine was performed. No intravenous contrast was administered. COMPARISON:  12/12/2021 FINDINGS: Segmentation:  Standard. Alignment:  Physiologic. Vertebrae:  No fracture, evidence of discitis, or bone lesion. Conus medullaris and cauda equina: Conus extends to the T12-L1 level. Conus and cauda equina appear normal. Paraspinal and other soft tissues: No perispinal mass or inflammation. Disc levels: Normal disc height and hydration. No facet spurring. Tiny fluid intensities behind the L3-4 and L4-5 facets without underlying degeneration or inflammation. IMPRESSION: Stable and negative lumbar MRI. Electronically Signed   By: Jorje Guild M.D.   On: 03/14/2022 03:57    Procedures Procedures    Medications Ordered in ED Medications  lidocaine (LIDODERM) 5 % 1 patch (has no administration in time range)  ketorolac (TORADOL) injection 60 mg (60 mg Intramuscular Given 03/13/22 2007)  cyclobenzaprine (FLEXERIL) tablet 5 mg (5 mg Oral Given 03/13/22 2004)  dexamethasone (DECADRON) tablet 4 mg (4 mg Oral Given 03/13/22 2004)    ED Course/ Medical Decision Making/ A&P                             Medical Decision Making Amount and/or Complexity of Data Reviewed Radiology: ordered.   Patient presents today with complaints of back pain. He is afebrile, non-toxic appearing, and in no acute distress with reassuring vital signs. Physical exam reveals midline low back tenderness to palpation. He also endorses subjective left leg numbness. No other neurologic deficits noted. Given these symptoms, patient received MRI which reveals no acute findings.  I have personally reviewed and interpreted this imaging and  agree with radiology interpretation.  Patient received Toradol, Flexeril, and Decadron ordered by triage provider prior to my evaluation.  He does state that the pain has improved.  I have also given him a lidocaine patch.  He is able to ambulate. Patient likely with muscle strain.  Patient is understanding and in agreement. Will give prescriptions for robaxin, naproxen, and lidocaine which he states have helped him in the past.  I have also attached stretching exercises for him to use at home.  Patient unfortunately does not have a primary care doctor to follow-up with, will give information about the wellness center for follow-up. He is stable for discharge, educated on red flag symptoms that would prompt immediate  return.  Patient discharged in stable condition.   Final Clinical Impression(s) / ED Diagnoses Final diagnoses:  Acute left-sided low back pain with left-sided sciatica    Rx / DC Orders ED Discharge Orders          Ordered    methocarbamol (ROBAXIN) 500 MG tablet  Every 6 hours PRN        03/14/22 0433    naproxen (NAPROSYN) 500 MG tablet  2 times daily        03/14/22 0433    lidocaine (LIDODERM) 5 %  Every 24 hours        03/14/22 0433          An After Visit Summary was printed and given to the patient.     Nestor Lewandowsky 03/14/22 0435    Orpah Greek, MD 03/14/22 [phone]

## 2022-03-14 NOTE — Discharge Instructions (Addendum)
As we discussed, your workup in the ER was reassuring for acute findings.  MRI of your lower back did not reveal any emergent concerns.  I have given you a prescription for naproxen, Robaxin, and lidocaine patches for you to use as prescribed as needed for management of your pain.  Please do not drive or operate heavy machinery after taking Robaxin as it can be sedating.  I have also given you a referral to the wellness center where you can follow-up for continued evaluation and management of your symptoms.  I have also attached stretching exercises which you can do with pain being your limiting factor to help rehabilitate your injury.  Return if development of any new or worsening symptoms.

## 2022-03-14 NOTE — ED Notes (Signed)
RN reviewed discharge instructions with pt. Pt verbalized understanding and had no further questions. VSS upon discharge. ?

## 2022-03-22 ENCOUNTER — Encounter (HOSPITAL_COMMUNITY): Payer: Self-pay

## 2022-03-22 ENCOUNTER — Ambulatory Visit (HOSPITAL_COMMUNITY)
Admission: EM | Admit: 2022-03-22 | Discharge: 2022-03-22 | Disposition: A | Discharge status: Home / Self Care | Payer: 59 | Attending: Family Medicine | Admitting: Family Medicine

## 2022-03-22 DIAGNOSIS — Z202 Contact with and (suspected) exposure to infections with a predominantly sexual mode of transmission: Secondary | ICD-10-CM | POA: Diagnosis not present

## 2022-03-22 DIAGNOSIS — Z113 Encounter for screening for infections with a predominantly sexual mode of transmission: Secondary | ICD-10-CM | POA: Diagnosis present

## 2022-03-22 LAB — HIV ANTIBODY (ROUTINE TESTING W REFLEX): HIV Screen 4th Generation wRfx: NONREACTIVE

## 2022-03-22 NOTE — Discharge Instructions (Signed)
Staff will notify you of any positives on the swab or blood test.  If you see anything coming positive on MyChart over the weekend when we do not have a callback nurse, please call his clinic

## 2022-03-22 NOTE — ED Triage Notes (Signed)
Pt states he is here for STD testing. Denies any symptoms.

## 2022-03-22 NOTE — ED Provider Notes (Addendum)
[facility]    CSN: [id number] Arrival date & time: 03/22/22  [id number]      History   Chief Complaint Chief Complaint  Patient presents with   std testing    HPI Todd Holloway is a 27 y.o. male.   HPI Here for screening for sexually transmitted diseases.  He has no dysuria, penile itching, or discharge.  No abdominal pain or fever.    Past Medical History:  Diagnosis Date   Asthma     Patient Active Problem List   Diagnosis Date Noted   HSV (herpes simplex virus) anogenital infection 06/11/2016    History reviewed. No pertinent surgical history.     Home Medications    Prior to Admission medications   Medication Sig Start Date End Date Taking? Authorizing Provider  lidocaine (LIDODERM) 5 % Place 1 patch onto the skin daily. Remove & Discard patch within 12 hours or as directed by MD 03/14/22   Smoot, Leary Roca, PA-C  methocarbamol (ROBAXIN) 500 MG tablet Take 1 tablet (500 mg total) by mouth every 6 (six) hours as needed for muscle spasms. 03/14/22   Smoot, Leary Roca, PA-C  naproxen (NAPROSYN) 500 MG tablet Take 1 tablet (500 mg total) by mouth 2 (two) times daily. 03/14/22   Smoot, Leary Roca, PA-C    Family History Family History  Problem Relation Age of Onset   Asthma Father     Social History Social History   Tobacco Use   Smoking status: Never   Smokeless tobacco: Never  Vaping Use   Vaping Use: Never used  Substance Use Topics   Alcohol use: No   Drug use: No     Allergies   Patient has no known allergies.   Review of Systems Review of Systems   Physical Exam Triage Vital Signs ED Triage Vitals [03/22/22 0845]  Enc Vitals Group     BP 124/80     Pulse Rate 72     Resp 16     Temp 97.7 F (36.5 C)     Temp Source Oral     SpO2 98 %     Weight      Height      Head Circumference      Peak Flow      Pain Score 0     Pain Loc      Pain Edu?      Excl. in [place]?    No data found.  Updated Vital Signs BP 124/80 (BP  Location: Right Arm)   Pulse 72   Temp 97.7 F (36.5 C) (Oral)   Resp 16   SpO2 98%   Visual Acuity Right Eye Distance:   Left Eye Distance:   Bilateral Distance:    Right Eye Near:   Left Eye Near:    Bilateral Near:     Physical Exam Vitals reviewed.  Constitutional:      General: He is not in acute distress.    Appearance: He is not ill-appearing, toxic-appearing or diaphoretic.  Skin:    Coloration: Skin is not pale.  Neurological:     Mental Status: He is alert and oriented to person, place, and time.  Psychiatric:        Behavior: Behavior normal.      UC Treatments / Results  Labs (all labs ordered are listed, but only abnormal results are displayed) Labs Reviewed  HIV ANTIBODY (ROUTINE TESTING W REFLEX)  RPR  CYTOLOGY, (ORAL, ANAL, URETHRAL) ANCILLARY  ONLY    EKG   Radiology No results found.  Procedures Procedures (including critical care time)  Medications Ordered in UC Medications - No data to display  Initial Impression / Assessment and Plan / UC Course  I have reviewed the triage vital signs and the nursing notes.  Pertinent labs & imaging results that were available during my care of the patient were reviewed by me and considered in my medical decision making (see chart for details).        Urethral swab is done and staff will notify him of any positives and treat per protocol.  He is agreeable to screening HIV and RPR test, as it has been over a year since he has had either done. Final Clinical Impressions(s) / UC Diagnoses   Final diagnoses:  Screening for STDs (sexually transmitted diseases)     Discharge Instructions      Staff will notify you of any positives on the swab or blood test.  If you see anything coming positive on MyChart over the weekend when we do not have a callback nurse, please call his clinic     ED Prescriptions   None    PDMP not reviewed this encounter.   Barrett Henle, MD 03/22/22  [id number]    Barrett Henle, MD 03/22/22 [phone]

## 2022-03-23 LAB — SYPHILIS: RPR W/REFLEX TO RPR TITER AND TREPONEMAL ANTIBODIES, TRADITIONAL SCREENING AND DIAGNOSIS ALGORITHM: RPR Ser Ql: NONREACTIVE

## 2022-03-25 LAB — CYTOLOGY, (ORAL, ANAL, URETHRAL) ANCILLARY ONLY
Chlamydia: NEGATIVE
Comment: NEGATIVE
Comment: NEGATIVE
Comment: NORMAL
Neisseria Gonorrhea: NEGATIVE
Trichomonas: POSITIVE — AB

## 2022-03-26 ENCOUNTER — Telehealth (HOSPITAL_COMMUNITY): Payer: Self-pay | Admitting: Emergency Medicine

## 2022-03-26 MED ORDER — METRONIDAZOLE 500 MG PO TABS: 2000.0000 mg | ORAL_TABLET | Freq: Once | ORAL | 0 refills | Status: AC

## 2022-04-02 ENCOUNTER — Ambulatory Visit (HOSPITAL_COMMUNITY)
Admission: EM | Admit: 2022-04-02 | Discharge: 2022-04-02 | Disposition: A | Discharge status: Home / Self Care | Payer: 59 | Attending: Emergency Medicine | Admitting: Emergency Medicine

## 2022-04-02 ENCOUNTER — Encounter (HOSPITAL_COMMUNITY): Payer: Self-pay

## 2022-04-02 DIAGNOSIS — Z113 Encounter for screening for infections with a predominantly sexual mode of transmission: Secondary | ICD-10-CM | POA: Insufficient documentation

## 2022-04-02 DIAGNOSIS — Z711 Person with feared health complaint in whom no diagnosis is made: Secondary | ICD-10-CM

## 2022-04-02 NOTE — ED Triage Notes (Signed)
Patient reports that he was diagnosed with Trich and wants to make sure everything is 'gone".

## 2022-04-02 NOTE — Discharge Instructions (Addendum)
Please return with any concerns. °

## 2022-04-02 NOTE — ED Provider Notes (Addendum)
[facility]    CSN: [id number] Arrival date & time: 04/02/22  0808      History   Chief Complaint Chief Complaint  Patient presents with   SEXUALLY TRANSMITTED DISEASE    HPI Todd Holloway is a 27 y.o. male.  Presents for repeat STD testing He was seen 2/9 and tested, treated for positive trichomonas on 2/13 Today is 7 days post treatment. He wants to make sure "everything is gone"  Denies symptoms. No intercourse since last testing   Past Medical History:  Diagnosis Date   Asthma     Patient Active Problem List   Diagnosis Date Noted   HSV (herpes simplex virus) anogenital infection 06/11/2016    History reviewed. No pertinent surgical history.     Home Medications    Prior to Admission medications   Medication Sig Start Date End Date Taking? Authorizing Provider  lidocaine (LIDODERM) 5 % Place 1 patch onto the skin daily. Remove & Discard patch within 12 hours or as directed by MD 03/14/22   Smoot, Leary Roca, PA-C  methocarbamol (ROBAXIN) 500 MG tablet Take 1 tablet (500 mg total) by mouth every 6 (six) hours as needed for muscle spasms. 03/14/22   Smoot, Leary Roca, PA-C  naproxen (NAPROSYN) 500 MG tablet Take 1 tablet (500 mg total) by mouth 2 (two) times daily. 03/14/22   Smoot, Leary Roca, PA-C    Family History Family History  Problem Relation Age of Onset   Asthma Father     Social History Social History   Tobacco Use   Smoking status: Never   Smokeless tobacco: Never  Vaping Use   Vaping Use: Never used  Substance Use Topics   Alcohol use: No   Drug use: No     Allergies   Patient has no known allergies.   Review of Systems Review of Systems Negative as per HPI  Physical Exam Triage Vital Signs ED Triage Vitals  Enc Vitals Group     BP 04/02/22 0913 135/81     Pulse Rate 04/02/22 0913 78     Resp 04/02/22 0913 14     Temp 04/02/22 0913 98.1 F (36.7 C)     Temp Source 04/02/22 0913 Oral     SpO2 04/02/22 0913 97 %      Weight --      Height --      Head Circumference --      Peak Flow --      Pain Score 04/02/22 0914 0     Pain Loc --      Pain Edu? --      Excl. in [place]? --    No data found.  Updated Vital Signs BP 135/81 (BP Location: Left Arm)   Pulse 78   Temp 98.1 F (36.7 C) (Oral)   Resp 14   SpO2 97%    Physical Exam Vitals and nursing note reviewed.  Constitutional:      General: He is not in acute distress.    Appearance: Normal appearance.  HENT:     Mouth/Throat:     Pharynx: Oropharynx is clear.  Cardiovascular:     Rate and Rhythm: Normal rate and regular rhythm.     Pulses: Normal pulses.  Pulmonary:     Effort: Pulmonary effort is normal.  Neurological:     Mental Status: He is alert and oriented to person, place, and time.     UC Treatments / Results  Labs (all labs  ordered are listed, but only abnormal results are displayed) Labs Reviewed  CYTOLOGY, (ORAL, ANAL, URETHRAL) ANCILLARY ONLY    EKG  Radiology No results found.  Procedures Procedures   Medications Ordered in UC Medications - No data to display  Initial Impression / Assessment and Plan / UC Course  I have reviewed the triage vital signs and the nursing notes.  Pertinent labs & imaging results that were available during my care of the patient were reviewed by me and considered in my medical decision making (see chart for details).  Discussed that "test of cure" for trichomonas is not routinely done in male patients with symptom resolution. Additionally it has only been 7 days since he was treated and there is no indication for repeat testing today. Discussed if he develops new symptoms or has any possible exposures, can return.  Discussed safe sex practices.  Final Clinical Impressions(s) / UC Diagnoses   Final diagnoses:  Worried well     Discharge Instructions      Please return with any concerns     ED Prescriptions   None    PDMP not reviewed this encounter.   Ludella Pranger,  Vernice Jefferson 04/02/22 1004  Addendum: appears that lab acquired and ran a swab despite discontinuing order. Results negative.     Les Pou, [place] 04/05/22 1014

## 2022-04-03 LAB — CYTOLOGY, (ORAL, ANAL, URETHRAL) ANCILLARY ONLY
Chlamydia: NEGATIVE
Comment: NEGATIVE
Comment: NEGATIVE
Comment: NORMAL
Neisseria Gonorrhea: NEGATIVE
Trichomonas: NEGATIVE

## 2022-05-08 ENCOUNTER — Emergency Department (HOSPITAL_COMMUNITY): Payer: 59

## 2022-05-08 ENCOUNTER — Emergency Department (HOSPITAL_COMMUNITY)
Admission: EM | Admit: 2022-05-08 | Discharge: 2022-05-09 | Disposition: A | Discharge status: Home / Self Care | Payer: 59 | Attending: Emergency Medicine | Admitting: Emergency Medicine

## 2022-05-08 DIAGNOSIS — R55 Syncope and collapse: Secondary | ICD-10-CM | POA: Diagnosis present

## 2022-05-08 DIAGNOSIS — E876 Hypokalemia: Secondary | ICD-10-CM | POA: Insufficient documentation

## 2022-05-08 DIAGNOSIS — R519 Headache, unspecified: Secondary | ICD-10-CM | POA: Diagnosis not present

## 2022-05-08 LAB — BASIC METABOLIC PANEL
Anion gap: 9 (ref 5–15)
BUN: 11 mg/dL (ref 6–20)
CO2: 31 mmol/L (ref 22–32)
Calcium: 9.4 mg/dL (ref 8.9–10.3)
Chloride: 99 mmol/L (ref 98–111)
Creatinine, Ser: 1.35 mg/dL — ABNORMAL HIGH (ref 0.61–1.24)
GFR, Estimated: >60 mL/min (ref >60)
Glucose, Bld: 94 mg/dL (ref 70–99)
Potassium: 3.1 mmol/L — ABNORMAL LOW (ref 3.5–5.1)
Sodium: 139 mmol/L (ref 135–145)

## 2022-05-08 LAB — CBG MONITORING, ED: Glucose-Capillary: 108 mg/dL — ABNORMAL HIGH (ref 70–99)

## 2022-05-08 MED ORDER — SODIUM CHLORIDE 0.9 % IV BOLUS
1000.0000 mL | Freq: Once | INTRAVENOUS | Status: AC
  Administered 2022-05-08: 1000 mL via INTRAVENOUS

## 2022-05-08 MED ORDER — METOCLOPRAMIDE HCL 5 MG/ML IJ SOLN
10.0000 mg | Freq: Once | INTRAMUSCULAR | Status: AC
  Administered 2022-05-08: 10 mg via INTRAVENOUS
  Filled 2022-05-08: qty 2

## 2022-05-08 MED ORDER — DIPHENHYDRAMINE HCL 25 MG PO CAPS
25.0000 mg | ORAL_CAPSULE | Freq: Once | ORAL | Status: AC
  Administered 2022-05-08: 25 mg via ORAL
  Filled 2022-05-08: qty 1

## 2022-05-08 NOTE — ED Provider Notes (Signed)
[facility] Provider Note   CSN: [id number] Arrival date & time: 05/08/22  2214     History {Add pertinent medical, surgical, social history, OB history to HPI:1} Chief Complaint  Patient presents with   Loss of Consciousness    Todd Holloway is a 27 y.o. male.  HPI   Patient without significant medical history presenting with complaints of a syncopal episode.  Patient states that while he was at [facility] he started to feel unwell and then had a syncopal episode.  Does not remember syncopized in, patient's partner was at bedside and states that he fell to the ground, he did hit his head, he was unconscious, there is no shaking, no tongue biting, no urinary continence, last about 30 seconds, there is no postictal state.  Patient states that he this has happened in the past generally from dehydration, he does endorse that he started a new diet where he does not eat sugar, he states he has been eating drink without difficulty, does note that he has a slight headache, states to be going on all day, states its located just on the right side of his head, which is atypical for him, he does note that he had not 1 episode of visual changes, states his vision was blurred for about 1 to 2 seconds, is bilateral, he had no paresthesias no weakness in the upper or lower extremities, no slurred speech.  He has no medical problems.    Home Medications Prior to Admission medications   Medication Sig Start Date End Date Taking? Authorizing Provider  lidocaine (LIDODERM) 5 % Place 1 patch onto the skin daily. Remove & Discard patch within 12 hours or as directed by MD 03/14/22   Smoot, Leary Roca, PA-C  methocarbamol (ROBAXIN) 500 MG tablet Take 1 tablet (500 mg total) by mouth every 6 (six) hours as needed for muscle spasms. 03/14/22   Smoot, Leary Roca, PA-C  naproxen (NAPROSYN) 500 MG tablet Take 1 tablet (500 mg total) by mouth 2 (two) times daily. 03/14/22   Smoot,  Leary Roca, PA-C      Allergies    Patient has no known allergies.    Review of Systems   Review of Systems  Constitutional:  Negative for chills and fever.  Respiratory:  Negative for shortness of breath.   Cardiovascular:  Negative for chest pain.  Gastrointestinal:  Negative for abdominal pain.  Neurological:  Positive for syncope and headaches.    Physical Exam Updated Vital Signs BP (!) 152/98   Pulse 69   Temp 98.1 F (36.7 C) (Oral)   Resp 17   SpO2 98%  Physical Exam Vitals and nursing note reviewed.  Constitutional:      General: He is not in acute distress.    Appearance: He is not ill-appearing.  HENT:     Head: Normocephalic and atraumatic.     Comments: There is no deformity of the head present no raccoon eyes or Battle sign noted.    Nose: No congestion.     Mouth/Throat:     Mouth: Mucous membranes are moist.     Pharynx: Oropharynx is clear.     Comments: No trismus no torticollis no oral trauma present no tongue abrasions present. Eyes:     Extraocular Movements: Extraocular movements intact.     Conjunctiva/sclera: Conjunctivae normal.     Pupils: Pupils are equal, round, and reactive to light.  Cardiovascular:     Rate and  Rhythm: Normal rate and regular rhythm.     Pulses: Normal pulses.     Heart sounds: No murmur heard.    No friction rub. No gallop.  Pulmonary:     Effort: No respiratory distress.     Breath sounds: No wheezing, rhonchi or rales.  Musculoskeletal:     Right lower leg: No edema.     Left lower leg: No edema.     Comments: Spine was palpated slight tenderness to palpation around his C-spine, no deformities or crepitus present, he is moving his upper and lower extremities out difficulty.  No leg shortening no external or internal rotation present.  Skin:    General: Skin is warm and dry.  Neurological:     Mental Status: He is alert.     GCS: GCS eye subscore is 4. GCS verbal subscore is 5. GCS motor subscore is 6.     Cranial  Nerves: Cranial nerves 2-12 are intact. No cranial nerve deficit.     Sensory: Sensation is intact.     Motor: No weakness.     Coordination: Romberg sign negative. Finger-Nose-Finger Test normal.     Comments: Cranial nerves II through XII grossly intact no difficulty with word finding following two-step commands there is no unilateral weakness present.  Sensation intact to light touch.  Psychiatric:        Mood and Affect: Mood normal.     ED Results / Procedures / Treatments   Labs (all labs ordered are listed, but only abnormal results are displayed) Labs Reviewed  CBG MONITORING, ED - Abnormal; Notable for the following components:      Result Value   Glucose-Capillary 108 (*)    All other components within normal limits  BASIC METABOLIC PANEL  CBC WITH DIFFERENTIAL/PLATELET    EKG EKG Interpretation  Date/Time:  Wednesday May 08 2022 22:22:58 EDT Ventricular Rate:  68 PR Interval:  136 QRS Duration: 84 QT Interval:  385 QTC Calculation: 410 R Axis:   83 Text Interpretation: Sinus rhythm Borderline T abnormalities, inferior leads Borderline ST elevation, lateral leads No significant change since last tracing Confirmed by Deno Etienne [phone]) on 05/08/2022 10:25:05 PM  Radiology No results found.  Procedures Procedures  {Document cardiac monitor, telemetry assessment procedure when appropriate:1}  Medications Ordered in ED Medications  metoCLOPramide (REGLAN) injection 10 mg (has no administration in time range)  sodium chloride 0.9 % bolus 1,000 mL (has no administration in time range)  diphenhydrAMINE (BENADRYL) capsule 25 mg (has no administration in time range)    ED Course/ Medical Decision Making/ A&P   {   Click here for ABCD2, HEART and other calculatorsREFRESH Note before signing :1}                          Medical Decision Making Amount and/or Complexity of Data Reviewed Labs: ordered. Radiology: ordered.  Risk Prescription drug  management.   This patient presents to the ED for concern of syncope, this involves an extensive number of treatment options, and is a complaint that carries with it a high risk of complications and morbidity.  The differential diagnosis includes arrhythmia, PE, orthostatic hypotension, CVA, intracranial head bleed    Additional history obtained:  Additional history obtained from partner at bedside External records from outside source obtained and reviewed including recent ER notes   Co morbidities that complicate the patient evaluation  N/A  Social Determinants of Health:  ***  Lab Tests:  I Ordered, and personally interpreted labs.  The pertinent results include:  ***   Imaging Studies ordered:  I ordered imaging studies including CT head, C-spine I independently visualized and interpreted imaging which showed *** I agree with the radiologist interpretation   Cardiac Monitoring:  The patient was maintained on a cardiac monitor.  I personally viewed and interpreted the cardiac monitored which showed an underlying rhythm of: EKG without signs of ischemia   Medicines ordered and prescription drug management:  I ordered medication including urine cocktail, I have reviewed the patients home medicines and have made adjustments as needed  Critical Interventions:  ***   Reevaluation:  Presents with syncopal episode, he had a benign physical exam, he is endorsing headache as well as neck pain, will CT head and C-spine for further evaluation, provide migraine cocktail and reassess.  Consultations Obtained:  I requested consultation with the ***,  and discussed lab and imaging findings as well as pertinent plan - they recommend: ***    Test Considered:  ***    Rule out ****    Dispostion and problem list  After consideration of the diagnostic results and the patients response to treatment, I feel that the patent would benefit from ***.        {Document critical care time when appropriate:1} {Document review of labs and clinical decision tools ie heart score, Chads2Vasc2 etc:1}  {Document your independent review of radiology images, and any outside records:1} {Document your discussion with family members, caretakers, and with consultants:1} {Document social determinants of health affecting pt's care:1} {Document your decision making why or why not admission, treatments were needed:1} Final Clinical Impression(s) / ED Diagnoses Final diagnoses:  None    Rx / DC Orders ED Discharge Orders     None
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
## 2022-05-09 LAB — CBC WITH DIFFERENTIAL/PLATELET
Abs Immature Granulocytes: 0.02 10*3/uL (ref 0.00–0.07)
Basophils Absolute: 0.1 10*3/uL (ref 0.0–0.1)
Basophils Relative: 1 %
Eosinophils Absolute: 0.3 10*3/uL (ref 0.0–0.5)
Eosinophils Relative: 4 %
HCT: 41.9 % (ref 39.0–52.0)
Hemoglobin: 14.6 g/dL (ref 13.0–17.0)
Immature Granulocytes: 0 %
Lymphocytes Relative: 34 %
Lymphs Abs: 2.4 10*3/uL (ref 0.7–4.0)
MCH: 31.5 pg (ref 26.0–34.0)
MCHC: 34.8 g/dL (ref 30.0–36.0)
MCV: 90.5 fL (ref 80.0–100.0)
Monocytes Absolute: 0.8 10*3/uL (ref 0.1–1.0)
Monocytes Relative: 11 %
Neutro Abs: 3.7 10*3/uL (ref 1.7–7.7)
Neutrophils Relative %: 50 %
Platelets: 223 10*3/uL (ref 150–400)
RBC: 4.63 MIL/uL (ref 4.22–5.81)
RDW: 12.4 % (ref 11.5–15.5)
WBC: 7.2 10*3/uL (ref 4.0–10.5)
nRBC: 0 % (ref 0.0–0.2)

## 2022-05-09 MED ORDER — POTASSIUM CHLORIDE ER 10 MEQ PO TBCR: 10.0000 meq | EXTENDED_RELEASE_TABLET | Freq: Every day | ORAL | 0 refills | Status: DC

## 2022-05-09 NOTE — Discharge Instructions (Addendum)
Lab work shows that your potassium is slightly low, given you potassium pills please take as prescribed.  Please remember to stay hydrated, eat frequent snacks this can help decrease episodes of syncope.  Please follow-up with your primary doctor for reassessment  Come back to the emergency department if you develop chest pain, shortness of breath, severe abdominal pain, uncontrolled nausea, vomiting, diarrhea.

## 2022-05-13 ENCOUNTER — Ambulatory Visit: Payer: 59 | Admitting: Internal Medicine

## 2022-05-13 ENCOUNTER — Emergency Department (HOSPITAL_BASED_OUTPATIENT_CLINIC_OR_DEPARTMENT_OTHER)
Admission: EM | Admit: 2022-05-13 | Discharge: 2022-05-13 | Disposition: A | Discharge status: Home / Self Care | Payer: 59 | Attending: Emergency Medicine | Admitting: Emergency Medicine

## 2022-05-13 ENCOUNTER — Emergency Department (HOSPITAL_BASED_OUTPATIENT_CLINIC_OR_DEPARTMENT_OTHER): Payer: 59

## 2022-05-13 ENCOUNTER — Other Ambulatory Visit: Payer: Self-pay

## 2022-05-13 ENCOUNTER — Encounter (HOSPITAL_BASED_OUTPATIENT_CLINIC_OR_DEPARTMENT_OTHER): Payer: Self-pay

## 2022-05-13 DIAGNOSIS — S8254XA Nondisplaced fracture of medial malleolus of right tibia, initial encounter for closed fracture: Secondary | ICD-10-CM

## 2022-05-13 DIAGNOSIS — S99921A Unspecified injury of right foot, initial encounter: Secondary | ICD-10-CM | POA: Diagnosis present

## 2022-05-13 DIAGNOSIS — M79671 Pain in right foot: Secondary | ICD-10-CM

## 2022-05-13 DIAGNOSIS — Y92410 Unspecified street and highway as the place of occurrence of the external cause: Secondary | ICD-10-CM | POA: Insufficient documentation
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
## 2022-05-13 NOTE — Discharge Instructions (Signed)
Your x-rays show possible small avulsion fracture or chip fracture of your right ankle.  I would recommend that you use crutches and be nonweightbearing for the next 7 days.  Please call to schedule follow-up appointment with an orthopedic surgeon.  Try to keep your foot elevated is much as possible at home.  You can take over the counter Ibuprofen and Tylenol for pain and swelling.  After 7 days, you can attempt to bear weight as tolerated.  If you are not able to bear weight on your right leg due to pain, you should go back to using crutches and follow-up with the orthopedic doctor.

## 2022-05-27 ENCOUNTER — Encounter: Payer: Self-pay | Admitting: Internal Medicine

## 2022-05-27 ENCOUNTER — Ambulatory Visit (INDEPENDENT_AMBULATORY_CARE_PROVIDER_SITE_OTHER): Payer: 59 | Admitting: Internal Medicine

## 2022-05-27 VITALS — BP 120/78 | HR 90 | Temp 98.5°F | Ht 75.0 in | Wt 211.6 lb

## 2022-05-27 DIAGNOSIS — Z119 Encounter for screening for infectious and parasitic diseases, unspecified: Secondary | ICD-10-CM | POA: Diagnosis not present

## 2022-05-27 DIAGNOSIS — F411 Generalized anxiety disorder: Secondary | ICD-10-CM

## 2022-05-27 DIAGNOSIS — F09 Unspecified mental disorder due to known physiological condition: Secondary | ICD-10-CM | POA: Diagnosis not present

## 2022-05-27 DIAGNOSIS — F4310 Post-traumatic stress disorder, unspecified: Secondary | ICD-10-CM | POA: Diagnosis not present

## 2022-05-27 DIAGNOSIS — R55 Syncope and collapse: Secondary | ICD-10-CM

## 2022-05-27 DIAGNOSIS — H5350 Unspecified color vision deficiencies: Secondary | ICD-10-CM

## 2022-05-27 HISTORY — DX: Syncope and collapse: R55

## 2022-05-27 MED ORDER — FLUOXETINE HCL 20 MG PO CAPS
20.0000 mg | ORAL_CAPSULE | Freq: Every day | ORAL | 3 refills | Status: AC
Start: 2022-05-27 — End: ?

## 2022-05-27 MED ORDER — CLONIDINE HCL 0.1 MG PO TABS
0.1000 mg | ORAL_TABLET | Freq: Three times a day (TID) | ORAL | 3 refills | Status: AC
Start: 2022-05-27 — End: ?

## 2022-05-27 NOTE — Assessment & Plan Note (Addendum)
Start fluoxetine and clonidine for associated symptom(s) but explained these won't resolve the problem. Put AVS handout Encouraged close follow up, return to clinic 1 month.  Advised patient to call  [facility] Address: [address] [place], [place], [address] Phone: [phone] Services: Individual Counseling, Family Therapy, Couples Counseling, Trauma Therapy, Stress Management, Depression Treatment, Anxiety Treatment, and Grief Counseling   [name], [facility] Address: [address] [place], [place], [place], [address] Phone: [phone] Specializes in EMDR, PTSD, anxiety, cognitive behavioral therapy, and other disorders   [facility] Address: [address] [place], [place], [place], [address] Phone: [phone] Offers professional counseling specializing in Complex Trauma and Dissociative Disorders, mood disorders, personality disorders, and EMDR 3.  [facility] Address: [address], [place], [place], [address] Phone: [phone] Specializes in trauma, addiction, and perinatal therapy services 4.  [facility] [address] [place], [place] [address] [phone]

## 2022-05-27 NOTE — Assessment & Plan Note (Signed)
My impression is generalized anxiety disorder but it could be wrong, he got very anxious about our medical discussion today. Girlfriend who brought him feels its generalized anxiety disorder. Start fluoxetine due to response in family Return to clinic 1 month, might need psychiatry if poor response.

## 2022-05-27 NOTE — Patient Instructions (Addendum)
As we embark on this healthcare journey together, it's important to understand that our initial consultation is the first step in a comprehensive, ongoing process of care. Today, we're dedicated to establishing a strong foundation for your primary care partnership and building a detailed medical history. We addressed your most pressing concerns and charted a course for future care.  Managing every aspect of your health in a single visit isn't always feasible.  Acute conditions or preventive care measures may require further attention.  We encourage you to schedule a follow-up visit at your earliest convenience to address any unresolved issues. Additionally, to ensure a thorough understanding of your health and maintain optimal wellness, we recommend scheduling an Annual Comprehensive Physical Exam (CPE), also known as a Preventive Medicine Visit or Annual Wellness Visit.  It was a pleasure seeing you today!  Your health and satisfaction will always be my top priorities. If you believe your experience today was worthy of a 5-star rating, I'd be grateful for your feedback! Todd Olszewski, MD   Todd Holloway was seen today for new patient (initial visit) and follow-up.  Cognitive disorder -     Ambulatory referral to Neuropsychology  Screening examination for infectious disease -     Hepatitis C antibody; Future -     HIV Antibody (routine testing w rflx); Future  Syncope, unspecified syncope type Overview: He has now had a total of 3 syncopes all 3 of which were associated with heart rhythm irregularity there may have been a fourth 1 as well a little further back Also the symptoms have been associated with high blood pressure although he is normotensive today. It seems that stress brings on the problem more than exertion He does take a creatine supplement  Assessment & Plan: I advised the patient that I am nearly certain that the syncope is due to his heart based on the symptoms and it might even be a  result of the creatine supplement so I advised him to stop that.  It does not seem like there is a significant alcohol or substance use issue or testosterone so I think it is due to the creatine the energy drinks that he was drinking and is going to quit or hypertrophic cardiomyopathy.  He also points out sometimes he gets some restless right-sided chest pain so as a stretch diagnostic possibility may be a coronary artery blockage somewhere but seems less likely.  I advised him most likely cardiology will consider stress test echocardiogram and electrocardiographic long-term monitoring the meantime he will not do any severe extreme exertion and I wrote a letter to support that  Orders: -     Comprehensive metabolic panel; Future -     Basic metabolic panel; Future -     CBC; Future -     Lipid panel; Future -     Ambulatory referral to Cardiology -     Magnesium -     TSH  PTSD (post-traumatic stress disorder) Overview: Bad childhood Flashbacks and nightmares  Assessment & Plan: [facility] Address: [address], [place], [place], [address] Phone: [phone] Services: Individual Counseling, Family Therapy, Couples Counseling, Trauma Therapy, Stress Management, Depression Treatment, Anxiety Treatment, and Grief Counseling   Todd Holloway, [facility] Address: [address] [place], [place], [place], [address] Phone: [phone] Specializes in EMDR, PTSD, anxiety, cognitive behavioral therapy, and other disorders   [facility] Address: [address] [place], [place], [place], [address] Phone: [phone] Offers professional counseling  specializing in Complex Trauma and Dissociative Disorders, mood disorders, personality disorders, and EMDR 3.  [facility] Address: [address], [place], [place], [address] Phone: [phone] Specializes in trauma, addiction, and perinatal therapy services 4.  [facility] [address] [place], [place] [address] [phone]   Orders: -     cloNIDine HCl; Take 1 tablet (0.1 mg total) by mouth 3 (three) times daily. Take as needed for flash backs, night terrors, blood pressure over 180  Dispense: 90 tablet; Refill: 3  Generalized anxiety disorder -     FLUoxetine HCl; Take 1 capsule (20 mg total) by mouth daily.  Dispense: 90 capsule; Refill: 3  Color blind      Next Steps: Schedule Follow-Up:  We recommend a follow-up appointment in No follow-ups on file.. If your condition worsens before then, please call us or seek emergency care.  Preventive Care:  Don't forget to schedule your annual preventive care visit!  This important checkup is typically covered by insurance and helps identify potential health issues early.  Typically its 100% insurance covered with no co-pay and helps to get surveillance labwork paid for.  Lab & X-ray Appointments:  Scheduled any incomplete lab tests today or call us to schedule.  XRays can be done without an appointment at [facility] ([address]), M-F 8:30am-noon or 1pm-5pm.  Just tell them you're there for X-rays ordered by Dr. Jon Billings.  We'll receive the results and contact you by phone or MyChart to discuss next steps.  Medical Information Release:  If you have any relevant medical information we don't have, please sign a release form so we can obtain it for your records.  Bring to Your Next Appointment: Medications: Please bring all your medication bottles to your next appointment to ensure we have an accurate record of your prescriptions. Health Diaries: If you're monitoring any health conditions at home, keeping a diary of your readings can be very helpful for discussions at your next appointment.  Please Review your early draft clinical notes below and the final encounter summary tomorrow on MyChart after its been completed.     Getting Answers and Following Up: Simple  Questions & Concerns: For quick questions or basic follow-up after your visit, reach [place] at [phone] or MyChart messaging. Complex Concerns: If your concern is more complex, scheduling an appointment might be best. Discuss this with the staff to find the most suitable option. Lab & Imaging Results: We'll contact you directly if results are abnormal or you don't use MyChart. Most normal results will be on MyChart within 2-3 business days, with a review message from Dr. Jon Billings. Haven't heard back in 2 weeks? Need results sooner? Contact us at [phone]. Referrals: Our referral coordinator will manage specialist referrals. The specialist's office should contact you within 2 weeks to schedule an appointment. Call us if you haven't heard from them after 2 weeks.  Staying Connected:  MyChart: Activate your MyChart for the fastest way to access results and message [place]. See the last page of this paperwork for instructions.  Billing: X-ray & Lab Orders: These are billed by separate companies. Contact the invoicing company directly for questions or concerns. Visit Charges: Discuss any billing inquiries with our administrative services team.  Feedback & Satisfaction: Share Your Experience: We strive for your satisfaction! If you have any complaints, please let Dr. Jon Billings know directly or contact our Practice  Administrators, Edwena Felty or [employer], by asking at the front desk.  Scheduling Tips: Shorter Wait Times: 8 am and 1 pm appointments often have the quickest wait times. Longer Appointments: If you need more time during your visit, talk to the front desk. Due to insurance regulations, multiple back-to-back appointments might be necessary.

## 2022-05-27 NOTE — Progress Notes (Signed)
[facility]  Phone: [phone]  New patient visit  Visit Date: 05/27/2022 Patient: Todd Holloway   DOB: 06-18-1995   27 y.o. Male  MRN: [id number] PCP:  [name], MD  (establishing care today)  Today's Health Care Provider: [name], MD   Assessment and Plan:   This initial visit focused on establishing a primary care relationship and gathering a comprehensive medical history. We addressed key concerns and prioritized next steps. For any remaining concerns, we encourage Todd Holloway to schedule a follow-up appointment at his earliest convenience.   To further complete the medical history and optimize health maintenance, we also recommend scheduling an Annual Comprehensive Physical Exam (CPE) also known as Preventive Medicine Visit.  Cognitive disorder Assessment & Plan: He requested neuropsych evaluation , referring to psychiatry   Orders: -     Ambulatory referral to Neuropsychology  Screening examination for infectious disease -     Hepatitis C antibody; Future -     HIV Antibody (routine testing w rflx); Future  Syncope, unspecified syncope type Assessment & Plan: Given the association between syncope/stress/anxiety/palpitations/high blood pressure and even headache(s)- and panic attack witnessed in office today, it seems he is spiking his blood pressure with stress induced adrenaline to cause all the symptom(s).   I advised the patient that I am nearly certain that the syncope is due to his heart based on the symptoms and it might even be a result of the creatine supplement so I advised him to stop that.  It does not seem like there is a significant alcohol or substance use issue or testosterone so I think it is due to the creatine the energy drinks that he was drinking and is going to quit or hypertrophic cardiomyopathy.  He also points out sometimes he gets some restless right-sided chest pain so as a stretch diagnostic possibility may be a  coronary artery blockage somewhere but seems less likely.  I advised him most likely cardiology will consider stress test echocardiogram and electrocardiographic long-term monitoring the meantime he will not do any severe extreme exertion and I wrote a letter to support that  Orders: -     Comprehensive metabolic panel; Future -     Basic metabolic panel; Future -     CBC; Future -     Lipid panel; Future -     Ambulatory referral to Cardiology -     Magnesium -     TSH  PTSD (post-traumatic stress disorder) Assessment & Plan: Start fluoxetine and clonidine for associated symptom(s) but explained these won't resolve the problem. Put AVS handout Encouraged close follow up, return to clinic 1 month.  Advised patient to call  [facility] Address: [address], [place], [place], [address] Phone: [phone] Services: Individual Counseling, Family Therapy, Couples Counseling, Trauma Therapy, Stress Management, Depression Treatment, Anxiety Treatment, and Grief Counseling   [name], [facility] Address: [address] [place], [place], [place], [address] Phone: [phone] Specializes in EMDR, PTSD, anxiety, cognitive behavioral therapy, and other disorders   [facility] Address: [address] [place], [place], [place], [address] Phone: [phone] Offers professional counseling specializing in Complex Trauma and Dissociative Disorders, mood disorders, personality disorders, and EMDR 3.  [facility] Address: [address], [place], [place], [address] Phone: [phone] Specializes in trauma, addiction, and perinatal therapy services 4.  [facility] [address] [place], [place]  [address] [phone]   Orders: -     cloNIDine HCl; Take 1 tablet (0.1 mg total) by mouth 3 (three) times daily. Take as needed for flash backs, night terrors, blood pressure over 180  Dispense: 90  tablet; Refill: 3  Generalized anxiety disorder Assessment & Plan: My impression is generalized anxiety disorder but it could be wrong, he got very anxious about our medical discussion today. Girlfriend who brought him feels its generalized anxiety disorder. Start fluoxetine due to response in family Return to clinic 1 month, might need psychiatry if poor response.  Orders: -     FLUoxetine HCl; Take 1 capsule (20 mg total) by mouth daily.  Dispense: 90 capsule; Refill: 3  Color blind      Subjective:   Todd Holloway presents today with intent to establish care with [name], MD as his Primary Care Provider (PCP) going forward.   His main concern(s) / chief complaint(s) are New Patient (Initial Visit) and Follow-up (Went to ED on 3/27 for syncope and collapse and hypokalemia.)   HPI  Problem-oriented charting was used to develop and update his medical history: Problem  Syncope   He has now had a total of 3 syncopes all 3 of which were associated with heart rhythm irregularity there may have been a fourth 1 as well a little further back Also the symptoms have been associated with high blood pressure although he is normotensive today. It seems that stress brings on the problem more than exertion and he has good exercise capacity There are close associations between syncope/stress/anxiety/palpitations/high blood pressure and even headache(s)-  He does take a creatine supplement   Ptsd (Post-Traumatic Stress Disorder)   Bad childhood Flashbacks and nightmares   Generalized Anxiety Disorder   Bipolar in family with response to Prozac.   Color Blind  Cognitive Disorder   History being advised he has autism but never tested      Depression Screen     No data to display         No results found for any visits on 05/27/22.  The following were reviewed and entered/updated into his MEDICAL RECORD NUMBERPast Medical History:  Diagnosis Date   Allergy    Asthma    Syncope  05/27/2022   He has now had a total of 3 syncopes all 3 of which were associated with heart rhythm irregularity there may have been a fourth 1 as well a little further back Also the symptoms have been associated with high blood pressure although he is normotensive today. It seems that stress brings on the problem more than exertion He does take a creatine supplement   History reviewed. No pertinent surgical history. Family History  Problem Relation Age of Onset   ADD / ADHD Mother    Asthma Mother    Asthma Father    Outpatient Medications Prior to Visit  Medication Sig Dispense Refill   albuterol (VENTOLIN HFA) 108 (90 Base) MCG/ACT inhaler Inhale into the lungs.     cetirizine (ZYRTEC) 10 MG tablet Take by mouth.     lidocaine (LIDODERM) 5 % Place 1 patch onto the skin daily. Remove & Discard patch within 12 hours or as directed by MD 10 patch 0   methocarbamol (ROBAXIN) 500 MG tablet Take 1 tablet (500 mg total) by mouth every 6 (six) hours as needed for muscle spasms. 25 tablet 0   naproxen (NAPROSYN) 500 MG tablet Take 1 tablet (500 mg total) by mouth 2 (two)  times daily. 30 tablet 0   potassium chloride (KLOR-CON) 10 MEQ tablet Take 1 tablet (10 mEq total) by mouth daily for 5 days. 5 tablet 0   No facility-administered medications prior to visit.    No Known Allergies Social History   Tobacco Use   Smoking status: Never   Smokeless tobacco: Never  Vaping Use   Vaping Use: Never used  Substance Use Topics   Alcohol use: No   Drug use: No    Immunization History  Administered Date(s) Administered   Influenza-Unspecified 12/20/2014, 12/08/2015   PFIZER(Purple Top)SARS-COV-2 Vaccination 09/21/2019, 10/12/2019   Tdap 11/07/2013    Objective:  Physical Exam  BP 120/78 (BP Location: Left Arm, Patient Position: Sitting)   Pulse 90   Temp 98.5 F (36.9 C) (Temporal)   Ht 6\' 3"  (1.905 m)   Wt 211 lb 9.6 oz (96 kg)   SpO2 96%   BMI 26.45 kg/m  Wt Readings from Last  10 Encounters:  05/27/22 211 lb 9.6 oz (96 kg)  05/13/22 211 lb (95.7 kg)  01/19/22 185 lb (83.9 kg)  12/12/21 200 lb (90.7 kg)  11/26/19 190 lb 0.6 oz (86.2 kg)  04/04/18 190 lb (86.2 kg)  09/01/17 195 lb (88.5 kg)  06/11/16 187 lb (84.8 kg)  06/07/16 195 lb (88.5 kg)  06/07/16 195 lb (88.5 kg)   Vital signs reviewed.  Nursing notes reviewed. Weight trend reviewed. Abnormalities and problem-specific physical exam findings:  very muscular and lean body compositiion- not overweight despite elevated BMI. Anxiety attack when discussing possible hocum followed by some unsteadiness on feet\ General Appearance:  Well developed, well nourished, well-groomed, healthy-appearing male with Body mass index is 26.45 kg/m. No acute distress appreciable.   Skin: Clear and well-hydrated. Pulmonary:  Normal work of breathing at rest, no respiratory distress apparent. SpO2: 96 %  Musculoskeletal: He demonstrates smooth and coordinated movements throughout all major joints. All extremities are intact.  Neurological:  Awake, alert, oriented, and engaged.  No obvious focal neurological deficits or cognitive impairments.  Sensorium seems unclouded. Gait is smooth and coordinated Psychiatric:  Appropriate mood, pleasant and cooperative demeanor, cheerful and engaged during the exam  Results Reviewed (reviewed labs/imaging may be also be found in the assessment / plan section):    Results for orders placed or performed during the hospital encounter of 05/08/22  Basic metabolic panel  Result Value Ref Range   Sodium 139 135 - 145 mmol/L   Potassium 3.1 (L) 3.5 - 5.1 mmol/L   Chloride 99 98 - 111 mmol/L   CO2 31 22 - 32 mmol/L   Glucose, Bld 94 70 - 99 mg/dL   BUN 11 6 - 20 mg/dL   Creatinine, Ser 1.61 (H) 0.61 - 1.24 mg/dL   Calcium 9.4 8.9 - 09.6 mg/dL   GFR, Estimated >04 >54 mL/min   Anion gap 9 5 - 15  CBC with Differential  Result Value Ref Range   WBC 7.2 4.0 - 10.5 K/uL   RBC 4.63 4.22 - 5.81  MIL/uL   Hemoglobin 14.6 13.0 - 17.0 g/dL   HCT 09.8 11.9 - 14.7 %   MCV 90.5 80.0 - 100.0 fL   MCH 31.5 26.0 - 34.0 pg   MCHC 34.8 30.0 - 36.0 g/dL   RDW 82.9 56.2 - 13.0 %   Platelets 223 150 - 400 K/uL   nRBC 0.0 0.0 - 0.2 %   Neutrophils Relative % 50 %   Neutro Abs 3.7 1.7 - 7.7  K/uL   Lymphocytes Relative 34 %   Lymphs Abs 2.4 0.7 - 4.0 K/uL   Monocytes Relative 11 %   Monocytes Absolute 0.8 0.1 - 1.0 K/uL   Eosinophils Relative 4 %   Eosinophils Absolute 0.3 0.0 - 0.5 K/uL   Basophils Relative 1 %   Basophils Absolute 0.1 0.0 - 0.1 K/uL   Immature Granulocytes 0 %   Abs Immature Granulocytes 0.02 0.00 - 0.07 K/uL  CBG monitoring, ED  Result Value Ref Range   Glucose-Capillary 108 (H) 70 - 99 mg/dL   Comment 1 Notify RN    Comment 2 Document in Chart

## 2022-05-27 NOTE — Assessment & Plan Note (Signed)
He requested neuropsych evaluation , referring to psychiatry

## 2022-05-27 NOTE — Assessment & Plan Note (Addendum)
Given the association between syncope/stress/anxiety/palpitations/high blood pressure and even headache(s)- and panic attack witnessed in office today, it seems he is spiking his blood pressure with stress induced adrenaline to cause all the symptom(s).   I advised the patient that I am nearly certain that the syncope is due to his heart based on the symptoms and it might even be a result of the creatine supplement so I advised him to stop that.  It does not seem like there is a significant alcohol or substance use issue or testosterone so I think it is due to the creatine the energy drinks that he was drinking and is going to quit or hypertrophic cardiomyopathy.  He also points out sometimes he gets some restless right-sided chest pain so as a stretch diagnostic possibility may be a coronary artery blockage somewhere but seems less likely.  I advised him most likely cardiology will consider stress test echocardiogram and electrocardiographic long-term monitoring the meantime he will not do any severe extreme exertion and I wrote a letter to support that   Patients chart review and interview were used to generate a prompt for artificial intelligence analysis ([employer] artificial intelligence) clinical decision support.  AI output was reviewed and is provided in red: I accidentally fed it the post appointment medication(s) list so it thinks he is already on fluoxetine buit we are just starting it. Comprehensive Review of the Case: A 27 year old male presented initially for an evaluation of recurrent syncope, with a total of three to possibly four episodes, all associated with heart rhythm irregularities and high blood pressure, though he is normotensive today. The episodes are more frequently induced by stress rather than exertion, despite a good exercise capacity. There is a noted association between his syncope episodes and stress, anxiety, palpitations, high blood pressure, and headaches. 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 His medical history includes allergies, asthma, and previous syncope episodes. He also has an HSV anogenital infection. Current medications include fluoxetine, albuterol, cetirizine, and clonidine. Laboratory findings reveal hypokalemia (K 3.1), elevated glucose (11 mmol/L), and slightly elevated creatinine (1.35 mg/dL). His EKG shows sinus rhythm with borderline T abnormalities in the inferior leads and borderline ST elevation in the lateral leads. Imaging of the head and cervical spine was normal. The patient's demographics, symptom information, site of care, and initial treatments, along with his response to these treatments, have been considered without any bias towards race, religion, gender identity, and/or sexual orientation. The review is consistent with the original clinical scenario and aims to be comprehensive, including minor laboratory abnormalities which could be crucial for diagnosis.  Clinical Problem Representation: A 27 year old male with a history of PTSD, generalized anxiety disorder, and a family history of bipolar disorder, presenting with recurrent stress-induced syncope episodes associated with heart rhythm irregularities, high blood pressure, and headaches. The patient has hypokalemia, elevated glucose, and slight renal impairment, with EKG changes suggestive of cardiac involvement. His episodes are not exertion-related, and he has a good exercise capacity. Current medications include fluoxetine, albuterol, cetirizine, and clonidine.   Most Likely Diagnoses: - Primary Arrhythmia: The patient's syncope episodes associated with heart rhythm irregularities and the EKG findings of borderline T abnormalities and ST elevation suggest a primary cardiac arrhythmia as a potential cause. These episodes,  particularly if stress-induced, could be related to catecholaminergic polymorphic ventricular tachycardia (CPVT) or  other forms of arrhythmogenic disorders, which can be exacerbated by emotional stress or physical activity. The patient's good exercise capacity might not rule out exercise-induced arrhythmias, as CPVT often presents with syncope or palpitations during physical activity or emotional stress.  - Pheochromocytoma/Paraganglioma: The association of syncope with palpitations, high blood pressure (though normotensive at the time of examination), and headaches could suggest a catecholamine-secreting tumor such as pheochromocytoma or paraganglioma. These tumors can cause episodic hypertension and arrhythmias leading to syncope. The stress-induced nature of his episodes aligns with the episodic release of catecholamines from these tumors.  - Secondary Hypertension with Hypokalemia: Given the patient's episodes of high blood pressure, hypokalemia, and syncope, secondary causes of hypertension such as primary aldosteronism should be considered. Hypokalemia can contribute to arrhythmias and syncope, and the episodic nature of his symptoms could be related to fluctuations in aldosterone secretion.   Expanded Differential Diagnoses: - Long QT Syndrome (LQTS): The patient's EKG findings and the association of syncope with stress and palpitations could also suggest Long QT Syndrome, especially if the QTc is borderline or intermittently prolonged. LQTS can lead to torsades de pointes and sudden cardiac arrest, particularly under stress or during exercise.  - Cardiomyopathy: Considering the patient's history and EKG findings, a form of cardiomyopathy, such as hypertrophic cardiomyopathy (HCM) or arrhythmogenic right ventricular cardiomyopathy (ARVC), could be underlying. These conditions can present with exercise or stress-induced arrhythmias, syncope, and sudden cardiac death.  - Electrolyte Imbalance-Induced  Arrhythmia: The patient's hypokalemia is a significant finding that could predispose him to various types of arrhythmias, including ventricular tachycardia or fibrillation. The source of the hypokalemia, whether dietary, gastrointestinal loss, or renal loss, needs to be identified and corrected to prevent further episodes.  - Psychogenic Pseudosyncope: Given the patient's significant psychiatric history, including PTSD and generalized anxiety disorder, psychogenic pseudosyncope should also be considered. This diagnosis is one of exclusion and can be considered if cardiac, neurological, and metabolic causes are ruled out.   Can't Miss Differential Diagnosis: - Acute Coronary Syndrome (ACS): Although less likely given the patient's age and symptom pattern, ACS must be considered as a life-threatening condition that can present with atypical symptoms, including syncope, especially in young patients with a possible family history of early cardiac disease.  - Aortic Dissection: Given the episodic high blood pressure and the potential for catastrophic outcomes, aortic dissection is a critical diagnosis to consider. While the patient's current presentation may not be classic for dissection, any history of severe chest pain or back pain associated with syncope episodes warrants further imaging.  - Pulmonary Embolism (PE): PE can present with syncope, especially if there is a significant obstruction of the pulmonary artery. While the patient's primary complaint and history do not strongly suggest PE, it remains a life-threatening condition that should not be missed.  This differential diagnosis list considers the patient's complex presentation, including his psychiatric history, which could influence the manifestation of his symptoms. Further diagnostic testing, including hormonal assays, cardiac imaging, and possibly genetic testing, will be crucial in narrowing down the diagnosis and guiding treatment.    Assessment  A 27 year old male presents with a history of recurrent syncope episodes, associated with heart rhythm irregularity, high blood pressure episodes, and stress, rather than exertion. His medical history is notable for PTSD, generalized anxiety disorder, color blindness, cognitive disorder, HSV anogenital infection, allergy, asthma, and previous syncope episodes. Current medications include fluoxetine, albuterol, cetirizine, and clonidine. Laboratory findings reveal hypokalemia, and EKG shows sinus rhythm with borderline T abnormalities and ST  elevation in lateral leads. CT head and cervical spine are normal.  Clinical Problem Representation: A 27 year old male with a history of PTSD, generalized anxiety disorder, and recurrent syncope episodes associated with heart rhythm irregularities and stress, currently on fluoxetine, albuterol, cetirizine, and clonidine, presents with hypokalemia and EKG changes.  Differential Diagnosis (DDx): 1. Cardiac Arrhythmias (e.g., ventricular tachycardia, supraventricular tachycardia) - Given the association of syncope with heart rhythm irregularity. 2. Neurocardiogenic Syncope - Considering the stress association and absence of exertional triggers. 3. Secondary Hypertension - Given episodes of high blood pressure, possibly contributing to syncope. 4. Electrolyte Imbalance (Hypokalemia) - Present in this case, which can predispose to cardiac arrhythmias. 5. Psychogenic Pseudosyncope - Considering the significant psychiatric history, though less likely given the objective findings.   Plan  Syncope: - Problem-specific Assessment: The recurrent nature of syncope, associated with heart rhythm irregularities and hypokalemia, suggests a cardiac etiology. The patient's psychiatric conditions and medication use also warrant consideration for their potential to contribute to or complicate the presentation. - Dx:   - Holter monitor or event recorder to capture and  identify arrhythmias.   - Echocardiogram to assess for structural heart disease.   - Repeat electrolyte panel focusing on potassium levels and management of hypokalemia.   - Tilt table test if neurocardiogenic syncope is strongly considered. - Tx:   - Address hypokalemia with potassium supplementation and review medications that may contribute to electrolyte imbalances.   - Consider referral to cardiology for management of identified arrhythmias or structural heart issues.   - Review and adjust psychiatric medications in consultation with psychiatry to minimize potential contributions to syncope.  PTSD and Generalized Anxiety Disorder: - Problem-specific Assessment: The patient's psychiatric conditions are well-established and currently treated with fluoxetine. The relationship between stress, anxiety, and syncope episodes highlights the need for effective management of these conditions. - Dx: Ongoing psychiatric evaluation to assess the efficacy of current treatment. - Tx:   - Continue fluoxetine with possible adjustment in dosage.   - Cognitive Behavioral Therapy (CBT) for PTSD and anxiety management.   - Stress management techniques and lifestyle modifications to reduce triggers for syncope.  Hypokalemia: - Problem-specific Assessment: Hypokalemia is present and may contribute to cardiac arrhythmias and syncope. - Dx: Re-evaluate serum potassium levels and assess for causes of hypokalemia, including dietary intake, medication effects, and renal losses. - Tx:   - Potassium supplementation to correct hypokalemia.   - Review medications for potential contributions to electrolyte imbalance.  Monitoring and Follow-Up: - Close follow-up with cardiology for cardiac evaluation and management. - Regular psychiatric follow-up to monitor and adjust treatment for PTSD and generalized anxiety disorder. - Repeat electrolyte panel to ensure resolution of hypokalemia. - Educate the patient on  recognizing signs of syncope and ensuring safety during episodes.  This comprehensive approach addresses the multifaceted aspects of this patient's presentation, aiming to optimize his cardiac and psychiatric health while minimizing the risk of recurrent syncope episodes.

## 2022-05-28 ENCOUNTER — Telehealth: Payer: Self-pay | Admitting: Internal Medicine

## 2022-05-28 NOTE — Telephone Encounter (Signed)
[facility] called and stated we need to send referral to another office. They do not do testings. Please advise

## 2022-05-30 ENCOUNTER — Ambulatory Visit (INDEPENDENT_AMBULATORY_CARE_PROVIDER_SITE_OTHER): Payer: 59 | Admitting: Orthopaedic Surgery

## 2022-05-30 ENCOUNTER — Encounter: Payer: Self-pay | Admitting: Orthopaedic Surgery

## 2022-05-30 DIAGNOSIS — S8251XA Displaced fracture of medial malleolus of right tibia, initial encounter for closed fracture: Secondary | ICD-10-CM | POA: Diagnosis not present

## 2022-05-30 NOTE — Progress Notes (Signed)
Office Visit Note   Patient: Todd Holloway           Date of Birth: 21-Mar-1995           MRN: [id number] Visit Date: 05/30/2022              Requested by: [name], MD [address] [place],  [place] [address] PCP: [name], MD   Assessment & Plan: Visit Diagnoses:  1. Avulsion fracture of medial malleolus of right tibia, closed, initial encounter     Plan: Impression is right medial malleolus avulsion fracture.  Patient symptoms are improving significantly.  Recommend continuing with Aleve as needed.  Will provide ASO brace for support and may wean as tolerated.  Advance activity as tolerated.  Follow-up as needed.  Follow-Up Instructions: No follow-ups on file.   Orders:  No orders of the defined types were placed in this encounter.  No orders of the defined types were placed in this encounter.     Procedures: No procedures performed   Clinical Data: No additional findings.   Subjective: Chief Complaint  Patient presents with   Right Ankle - Injury    DOI 05/12/2022    HPI  Patient is a 27 year old gentleman follow-up from the ER for injury to the right ankle on 05/13/2022.  Pain has significantly improved since the injury.  He is now wearing regular shoes.  Takes Aleve as needed.  Review of Systems  Constitutional: Negative.   HENT: Negative.    Eyes: Negative.   Respiratory: Negative.    Cardiovascular: Negative.   Gastrointestinal: Negative.   Endocrine: Negative.   Genitourinary: Negative.   Skin: Negative.   Allergic/Immunologic: Negative.   Neurological: Negative.   Hematological: Negative.   Psychiatric/Behavioral: Negative.    All other systems reviewed and are negative.    Objective: Vital Signs: There were no vitals taken for this visit.  Physical Exam Vitals and nursing note reviewed.  Constitutional:      Appearance: He is well-developed.  HENT:     Head: Normocephalic and atraumatic.  Eyes:     Pupils:  Pupils are equal, round, and reactive to light.  Pulmonary:     Effort: Pulmonary effort is normal.  Abdominal:     Palpations: Abdomen is soft.  Musculoskeletal:        General: Normal range of motion.     Cervical back: Neck supple.  Skin:    General: Skin is warm.  Neurological:     Mental Status: He is alert and oriented to person, place, and time.  Psychiatric:        Behavior: Behavior normal.        Thought Content: Thought content normal.        Judgment: Judgment normal.     Ortho Exam  Examination right ankle shows tenderness of the medial malleolus.  He has full motor and sensory function.  Specialty Comments:  No specialty comments available.  Imaging: No results found.   PMFS History: Patient Active Problem List   Diagnosis Date Noted   Avulsion fracture of medial malleolus of right tibia, closed, initial encounter 05/30/2022   Syncope 05/27/2022   PTSD (post-traumatic stress disorder) 05/27/2022   Generalized anxiety disorder 05/27/2022   Color blind 05/27/2022   Cognitive disorder 05/27/2022   HSV (herpes simplex virus) anogenital infection 06/11/2016   Past Medical History:  Diagnosis Date   Allergy    Asthma    Syncope 05/27/2022  He has now had a total of 3 syncopes all 3 of which were associated with heart rhythm irregularity there may have been a fourth 1 as well a little further back Also the symptoms have been associated with high blood pressure although he is normotensive today. It seems that stress brings on the problem more than exertion He does take a creatine supplement    Family History  Problem Relation Age of Onset   ADD / ADHD Mother    Asthma Mother    Asthma Father     History reviewed. No pertinent surgical history. Social History   Occupational History   Not on file  Tobacco Use   Smoking status: Never   Smokeless tobacco: Never  Vaping Use   Vaping Use: Never used  Substance and Sexual Activity   Alcohol use: No    Drug use: No   Sexual activity: Yes

## 2022-05-31 ENCOUNTER — Other Ambulatory Visit (INDEPENDENT_AMBULATORY_CARE_PROVIDER_SITE_OTHER): Payer: 59

## 2022-05-31 DIAGNOSIS — Z119 Encounter for screening for infectious and parasitic diseases, unspecified: Secondary | ICD-10-CM

## 2022-05-31 DIAGNOSIS — R55 Syncope and collapse: Secondary | ICD-10-CM | POA: Diagnosis not present

## 2022-05-31 LAB — COMPREHENSIVE METABOLIC PANEL
ALT: 54 U/L — ABNORMAL HIGH (ref 0–53)
AST: 45 U/L — ABNORMAL HIGH (ref 0–37)
Albumin: 4.5 g/dL (ref 3.5–5.2)
Alkaline Phosphatase: 60 U/L (ref 39–117)
BUN: 16 mg/dL (ref 6–23)
CO2: 31 mEq/L (ref 19–32)
Calcium: 10.1 mg/dL (ref 8.4–10.5)
Chloride: 99 mEq/L (ref 96–112)
Creatinine, Ser: 1.27 mg/dL (ref 0.40–1.50)
GFR: 77.54 mL/min (ref >60.00)
Glucose, Bld: 96 mg/dL (ref 70–99)
Potassium: 4.5 mEq/L (ref 3.5–5.1)
Sodium: 137 mEq/L (ref 135–145)
Total Bilirubin: 0.6 mg/dL (ref 0.2–1.2)
Total Protein: 7.3 g/dL (ref 6.0–8.3)

## 2022-05-31 LAB — CBC
HCT: 44.5 % (ref 39.0–52.0)
Hemoglobin: 15 g/dL (ref 13.0–17.0)
MCHC: 33.7 g/dL (ref 30.0–36.0)
MCV: 91.9 fl (ref 78.0–100.0)
Platelets: 222 10*3/uL (ref 150.0–400.0)
RBC: 4.84 Mil/uL (ref 4.22–5.81)
RDW: 13.1 % (ref 11.5–15.5)
WBC: 9.8 10*3/uL (ref 4.0–10.5)

## 2022-05-31 LAB — LIPID PANEL
Cholesterol: 160 mg/dL (ref 0–200)
HDL: 42 mg/dL (ref >39.00)
LDL Cholesterol: 104 mg/dL — ABNORMAL HIGH (ref 0–99)
NonHDL: 117.76
Total CHOL/HDL Ratio: 4
Triglycerides: 67 mg/dL (ref 0.0–149.0)
VLDL: 13.4 mg/dL (ref 0.0–40.0)

## 2022-06-01 LAB — HIV ANTIBODY (ROUTINE TESTING W REFLEX): HIV 1&2 Ab, 4th Generation: NONREACTIVE

## 2022-06-01 LAB — HEPATITIS C ANTIBODY: Hepatitis C Ab: NONREACTIVE

## 2022-06-04 ENCOUNTER — Ambulatory Visit: Payer: 59 | Admitting: Family Medicine

## 2022-06-04 NOTE — Telephone Encounter (Signed)
Sent a my chart message to patient with this information.

## 2022-06-13 ENCOUNTER — Emergency Department (HOSPITAL_COMMUNITY)
Admission: EM | Admit: 2022-06-13 | Discharge: 2022-06-13 | Disposition: A | Discharge status: Home / Self Care | Payer: 59 | Attending: Emergency Medicine | Admitting: Emergency Medicine

## 2022-06-13 ENCOUNTER — Emergency Department (HOSPITAL_COMMUNITY): Payer: 59

## 2022-06-13 ENCOUNTER — Other Ambulatory Visit: Payer: Self-pay

## 2022-06-13 ENCOUNTER — Encounter (HOSPITAL_COMMUNITY): Payer: Self-pay

## 2022-06-13 DIAGNOSIS — R079 Chest pain, unspecified: Secondary | ICD-10-CM | POA: Insufficient documentation

## 2022-06-13 DIAGNOSIS — R55 Syncope and collapse: Secondary | ICD-10-CM

## 2022-06-13 DIAGNOSIS — R42 Dizziness and giddiness: Secondary | ICD-10-CM | POA: Diagnosis not present

## 2022-06-13 DIAGNOSIS — R0602 Shortness of breath: Secondary | ICD-10-CM | POA: Diagnosis not present

## 2022-06-13 LAB — CBG MONITORING, ED: Glucose-Capillary: 135 mg/dL — ABNORMAL HIGH (ref 70–99)

## 2022-06-13 LAB — COMPREHENSIVE METABOLIC PANEL
ALT: 33 U/L (ref 0–44)
AST: 36 U/L (ref 15–41)
Albumin: 4.2 g/dL (ref 3.5–5.0)
Alkaline Phosphatase: 60 U/L (ref 38–126)
Anion gap: 10 (ref 5–15)
BUN: 15 mg/dL (ref 6–20)
CO2: 26 mmol/L (ref 22–32)
Calcium: 9.4 mg/dL (ref 8.9–10.3)
Chloride: 99 mmol/L (ref 98–111)
Creatinine, Ser: 1.31 mg/dL — ABNORMAL HIGH (ref 0.61–1.24)
GFR, Estimated: >60 mL/min (ref >60)
Glucose, Bld: 93 mg/dL (ref 70–99)
Potassium: 3.6 mmol/L (ref 3.5–5.1)
Sodium: 135 mmol/L (ref 135–145)
Total Bilirubin: 0.7 mg/dL (ref 0.3–1.2)
Total Protein: 7.5 g/dL (ref 6.5–8.1)

## 2022-06-13 LAB — CBC WITH DIFFERENTIAL/PLATELET
Abs Immature Granulocytes: 0 10*3/uL (ref 0.00–0.07)
Basophils Absolute: 0 10*3/uL (ref 0.0–0.1)
Basophils Relative: 1 %
Eosinophils Absolute: 0.1 10*3/uL (ref 0.0–0.5)
Eosinophils Relative: 2 %
HCT: 43.6 % (ref 39.0–52.0)
Hemoglobin: 15.1 g/dL (ref 13.0–17.0)
Immature Granulocytes: 0 %
Lymphocytes Relative: 31 %
Lymphs Abs: 1.7 10*3/uL (ref 0.7–4.0)
MCH: 30.9 pg (ref 26.0–34.0)
MCHC: 34.6 g/dL (ref 30.0–36.0)
MCV: 89.2 fL (ref 80.0–100.0)
Monocytes Absolute: 0.4 10*3/uL (ref 0.1–1.0)
Monocytes Relative: 7 %
Neutro Abs: 3.2 10*3/uL (ref 1.7–7.7)
Neutrophils Relative %: 59 %
Platelets: 230 10*3/uL (ref 150–400)
RBC: 4.89 MIL/uL (ref 4.22–5.81)
RDW: 12 % (ref 11.5–15.5)
WBC: 5.4 10*3/uL (ref 4.0–10.5)
nRBC: 0 % (ref 0.0–0.2)

## 2022-06-13 LAB — TROPONIN I (HIGH SENSITIVITY)
Troponin I (High Sensitivity): <2 ng/L (ref <18)
Troponin I (High Sensitivity): 2 ng/L (ref <18)

## 2022-06-13 LAB — BRAIN NATRIURETIC PEPTIDE: B Natriuretic Peptide: 7 pg/mL (ref 0.0–100.0)

## 2022-06-13 MED ORDER — KETOROLAC TROMETHAMINE 30 MG/ML IJ SOLN
15.0000 mg | Freq: Once | INTRAMUSCULAR | Status: AC
  Administered 2022-06-13: 15 mg via INTRAVENOUS
  Filled 2022-06-13: qty 1

## 2022-06-13 NOTE — Discharge Instructions (Addendum)
Todd Holloway  Thank you for allowing [place] to take care of you today.  You came to the Emergency Department today because an episode earlier today where you passed out while you are sitting down, you also had some chest pain shortness of breath during this.  We did several labs, they did a CT of your head that was normal, your heart number (troponin) was normal twice, and your BNP which is a marker of how much fluid you have in your body and how much stretch there is at the top part of your heart, which is a marker for heart failure was also normal.  Based on all this we feel that you are safe for discharge and outpatient follow-up.  Please attend your cardiology appointment which you report is on Monday.  To-Do: 1. Please follow-up with your primary doctor within 2 days / as soon as possible.   Please return to the Emergency Department or call 911 if you experience have worsening of your symptoms, or do not get better, new or different chest pain, shortness of breath, severe or significantly worsening pain, high fever, severe confusion, pass out or have any reason to think that you need emergency medical care.   We hope you feel better soon.   Curley Spice, MD Department of Emergency Medicine [facility]

## 2022-06-13 NOTE — ED Notes (Signed)
EMT was sitting at EMT desk and heard a loud noise, looked over to the left and it was a [employer] employee, with his head down on the desk, that was originally speaking with security. EMT went over to the pt and assessed pulse and pt had an adequate pulse and was also breathing adequately but was not responding. EMT performed a sternal rub and pt started to become more responsive. Once pt was responsive pt stated he didn't know what happened but had a cardiology history and would be going to the doctor soon. The pt continued to state they didn't know what happened but they were speaking in full sentences, breathing adequately with an adequate pulse. There appeared to be a contusion on the pts forehead after hitting his head on the security desk.

## 2022-06-13 NOTE — ED Provider Triage Note (Signed)
Emergency Medicine Provider Triage Evaluation Note  Todd Holloway , a 27 y.o. male  was evaluated in triage.  Pt complains of syncope.  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  He reportedly hit his head against a desk.  Patient reports that he has a history of some heart abnormalities that he is currently being worked up for with cardiology as there is concern for possible "enlarged heart".  Patient is currently taking clonidine.  Patient is unable to fully recall the events that occurred prior to the loss of consciousness but reportedly syncopal episode was very brief.  Currently endorsing some central chest pain and tightness.  Review of Systems  Positive: As above Negative: As above  Physical Exam  BP (!) 142/90 (BP Location: Left Arm)   Pulse 81   Temp 98.6 F (37 C) (Oral)   Resp 16   SpO2 96%  Gen:   Initially difficult to arouse but patient appeared to regain full consciousness and was conversing and communicating responding to questions without difficulty within 1 minute. Resp:  Normal effort  MSK:   Moves extremities without difficulty  Other:    Medical Decision Making  Medically screening exam initiated at 2:51 PM.  Appropriate orders placed.  Myrle Sheng was informed that the remainder of the evaluation will be completed by another provider, this initial triage assessment does not replace that evaluation, and the importance of remaining in the ED until their evaluation is complete.     Smitty Knudsen, PA-C 06/13/22 1452

## 2022-06-13 NOTE — ED Notes (Signed)
Called to x-ray for pt having syncopal episode. Per x-ray staff pt was standing for chest x-ray with his eyes closed, and went slowly backwards to the floor but did not strike his head.  RN and EMT to x-ray to evaluate patient Pt is alert and oriented.  Still reports chest pain.  Escorted via wheelchair back to triage and placed in recliner to await first available room.

## 2022-06-13 NOTE — ED Notes (Signed)
Pt is A&O x4. Denies HA, CP, or SOB. Denies N/V/D. Skin is intact.

## 2022-06-13 NOTE — ED Provider Notes (Signed)
EMERGENCY DEPARTMENT AT [facility] Provider Note  Medical Decision Making   HPI: Todd Holloway is a 27 y.o. male with history perinent for syncope, PTSD, GAD, colorblindness who presents complaining of syncope.  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  History provided by patient, family at bedside.  No interpreter required for this encounter.  Patient reports that he is undergoing a workup for "an enlarged heart", family at bedside clarifies that they believe it is for "hypertrophic cardiomyopathy".  Patient not have a known family history of structural cardiac disease, however family reports that patient is undergoing this evaluation because he has had multiple prior episodes of exertional syncope.  Reportedly has an appointment for the upcoming Monday 5/6 with [facility] cardiology.  Patient reports that until his cardiology appointment he has been told not to have his heart rate elevated above 120 otherwise he is at risk for syncope.  Reports that during his episode today he was feeling anxious and feels like his heart was going fast and developed chest pain, shortness of breath, lightheadedness, then syncopized, this was witnessed by his sister who is a security guard.  He denies any headache, reports that he has ongoing but improving chest pain, shortness of breath, lightheadedness.  Denies any current headache, lightheadedness  ROS: As per HPI. Please see MAR for complete past medical history, surgical history, and social history.   Physical exam is pertinent for lack of focal findings.   The differential includes but is not limited to vasovagal syncope, cardiac syncope, HCM, ACS, arrhythmia, pericardial tamponade, pericarditis, myocarditis, pneumonia, pneumothorax, esophageal, tear, perforated abdominal viscous, pulmonary embolism,  aortic dissection, costochondritis, musculoskeletal chest wall pain, GERD.    Additional history obtained from: Family at bedside, chart review External records from outside source obtained and reviewed including: Patient has had multiple prior visits for syncope and collapse, since that is usually attributed to vasovagal syncope  ED provider interpretation of ECG: rate 73, sinus rhythm, no ST elevations or depressions, nonspecific T wave inversions in lead V1, lead III.  ED provider interpretation of radiology/imaging: CT head without ICH, displaced fracture, no loss of gray/white matter differentiation.  Chest x-ray without focal airspace opacification, cardiomediastinal silhouette derangement, pleural effusion, pneumothorax, bony displacement.  Labs ordered were interpreted by myself as well as my attending and were incorporated into the medical decision making process for this patient.  ED provider interpretation of labs: CBC without leukocytosis, anemia, thrombocytopenia.  CMP without emergent electrolyte derangement, creatinine elevated 1.3, baseline appears to be approximately 1.2.  Initial troponin less than 2, delta troponin 2.  BNP WNL at 7  Interventions: Toradol  See the EMR for full details regarding lab and imaging results.   Todd Holloway is awake, alert, and HDS.  His exam is most notable for lack of focal findings.  The ECG reveals no anatomical ischemia representing STEMI, New-Onset Arrhythmia, or ischemic equivalent.  He has been risk stratified with a HEAR score of 1. Initial troponin is <2; delta troponin is 2.  The patient's presentation, the patient being hemodynamically stable, and the ECG are not consistent with Pericardial Tamponade. The patient's pain is not positional. This in conjunction with the lack of PR depressions and ST elevations on the ECG are reassuring against Pericarditis. The patient's non-elevated troponin and ECG are also inconsistent with  Myocarditis.  The CXR is unremarkable for focal airspace disease.  The patient is afebrile and denies productive cough.  Therefore, I do not suspect Pneumonia. There is no evidence of Pneumothorax on physical exam or on the CXR. CXR shows no evidence of Esophageal Tear and there is no recent intractable emesis or esophageal instrumentation. There is no peritonitis or free air on CXR worrisome for a Perforated Abdominal Viscous.  I do not think that the patient has a Pulmonary Embolism. The patient is PERC negative (age < 50, HR < 100, SpO2% > 95%, no unilateral leg swelling, no hemoptysis, no surgery or trauma requiring anesthesia within the past 4 weeks, no history of prior PE or DVT, and no hormone use).   The patient's pain is not tearing and it does not radiate to back. Pulses are present bilaterally in both the upper and lower extremities. CXR does not show a widened mediastinum. I have a very low suspicion for Aortic Dissection.   Overall, patient's history is less concerning for HCM given patient does not have any murmur appreciable on exam, reported syncope today was also not exertional in nature, overall will defer further workup of this to cardiology, additionally lower concern for heart failure given patient has BNP WNL and no lower extremity edema.  Overall unclear etiology of patient's syncope and chest pain today, however workup above is reassuring against underlying emergent etiology, therefore feel that patient is stable for discharge and outpatient follow-up.  Patient feels comfortable with this plan.  Consults: Not indicated  Disposition: DISCHARGE: I believe that the patient is safe for discharge home with outpatient follow-up. Patient was informed of all pertinent physical exam, laboratory, and imaging findings. Patient's suspected etiology of their symptom presentation was discussed with the patient and all questions were answered. We discussed following up with PCP, cardiology. I  provided thorough ED return precautions. The patient feels safe and comfortable with this plan.  The plan for this patient was discussed with Dr. Jeraldine Loots, who voiced agreement and who oversaw evaluation and treatment of this patient.  Clinical Impression:  1. Syncope and collapse    Discharge  Therapies: These medications and interventions were provided for the patient while in the ED. Medications  ketorolac (TORADOL) 30 MG/ML injection 15 mg (15 mg Intravenous Given 06/13/22 1743)    MDM generated using voice dictation software and may contain dictation errors.  Please contact me for any clarification or with any questions.  Clinical Complexity A medically appropriate history, review of systems, and physical exam was performed.  Collateral history obtained from: Family, chart review I personally reviewed the labs, EKG, imaging as discussed above. Patient's presentation is most consistent with acute complicated illness / injury requiring diagnostic workup Considered and ruled out life and body threatening conditions  Treatment: Outpatient follow-up Medications: Prescription Discussed patient's care with providers from the following different specialties: None  Physical Exam   ED Triage Vitals  Enc Vitals Group     BP 06/13/22 1450 (!) 142/90     Pulse Rate 06/13/22 1450 81     Resp 06/13/22 1450 16     Temp 06/13/22 1450 98.6 F (37 C)     Temp Source 06/13/22 1450 Oral     SpO2 06/13/22 1450 96 %     Weight 06/13/22 1553 211 lb 10.3 oz (96 kg)     Height 06/13/22 1553 6\' 3"  (1.905 m)     Head Circumference --      Peak Flow --  Pain Score --      Pain Loc --      Pain Edu? --      Excl. in GC? --      Physical Exam Vitals and nursing note reviewed.  Constitutional:      General: He is not in acute distress.    Appearance: He is well-developed.  HENT:     Head: Normocephalic and atraumatic.  Eyes:     Conjunctiva/sclera: Conjunctivae normal.   Cardiovascular:     Rate and Rhythm: Normal rate and regular rhythm.     Heart sounds: No murmur (No murmur at rest, with Valsalva, or with knee-to-chest positioning) heard. Pulmonary:     Effort: Pulmonary effort is normal. No respiratory distress.     Breath sounds: Normal breath sounds.  Abdominal:     Palpations: Abdomen is soft.     Tenderness: There is no abdominal tenderness.  Musculoskeletal:        General: No swelling.     Cervical back: Neck supple.  Skin:    General: Skin is warm and dry.     Capillary Refill: Capillary refill takes less than 2 seconds.  Neurological:     Mental Status: He is alert.  Psychiatric:        Mood and Affect: Mood normal.       Procedure Note  Procedures  CT Head Wo Contrast  Final Result    DG Chest 1 View  Final Result      Julianne Rice, MD Emergency Medicine, PGY-2   Curley Spice, MD 06/14/22 1610    Gerhard Munch, MD 06/14/22 2242

## 2022-06-13 NOTE — ED Triage Notes (Signed)
Pt sitting at desk, had a syncopal episode; c/o central sharp cp, sob, dizziness prior to and after event; pt alert and oriented on arrival to triage; pt states he has a cardiology appointment next week that could be related to a possible enlarged heart

## 2022-06-17 ENCOUNTER — Ambulatory Visit: Payer: 59 | Attending: Internal Medicine | Admitting: Internal Medicine

## 2022-06-17 ENCOUNTER — Ambulatory Visit: Payer: 59 | Attending: Internal Medicine

## 2022-06-17 ENCOUNTER — Encounter: Payer: Self-pay | Admitting: Internal Medicine

## 2022-06-17 VITALS — BP 112/68 | HR 65 | Ht 75.0 in | Wt 213.6 lb

## 2022-06-17 DIAGNOSIS — R55 Syncope and collapse: Secondary | ICD-10-CM

## 2022-06-17 NOTE — Progress Notes (Unsigned)
Enrolled for [employer] to mail a ZIO XT long term holter monitor to the patients address on file.  

## 2022-06-17 NOTE — Progress Notes (Signed)
Cardiology Office Note:    Date:  06/17/2022   ID:  Todd Holloway, DOB 1995/05/02, MRN [id number]  PCP:  [name], MD   [facility] Providers Cardiologist:  Maisie Fus, MD     Referring MD: [name], MD   No chief complaint on file. Syncope  History of Present Illness:    Todd Holloway is a 27 y.o. male with a hx of asthma, hx of syncope, referral from the ED for syncope.  Per ED notes 06/13/2022.  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  History provided by patient, family at bedside. Patient reports that he is undergoing a workup for "an enlarged heart", family at bedside clarifies that they believe it is for "hypertrophic cardiomyopathy".  Patient not have a known family history of structural cardiac disease, however family reports that patient is undergoing this evaluation because he has had multiple prior episodes of exertional syncope."  He had a prior syncopal event and went to the ED on 05/08/2022. "Per ED notes: Patient without significant medical history presenting with complaints of a syncopal episode. Patient states that while he was at [facility] he started to feel unwell and then had a syncopal episode. Does not remember syncopized in, patient's partner was at bedside and states that he fell to the ground, he did hit his head, he was unconscious, there is no shaking, no tongue biting, no urinary continence, last about 30 seconds, there is no postictal state. Patient states that he this has happened in the past generally from dehydration."  Today he states he did not know when he was going to faint. Did not happen when he was younger. No presyncopal events. He feels heart racing. He notes he feels chest pressure before syncope. He lifts weights and cardio. He can feel faint while exercising. This has all happened this  year.  No premature CAD  No smoking cigarettes   Past Medical History:  Diagnosis Date   Allergy    Asthma    Syncope 05/27/2022   He has now had a total of 3 syncopes all 3 of which were associated with heart rhythm irregularity there may have been a fourth 1 as well a little further back Also the symptoms have been associated with high blood pressure although he is normotensive today. It seems that stress brings on the problem more than exertion He does take a creatine supplement    No past surgical history on file.  Current Medications: Current Meds  Medication Sig   amoxicillin (AMOXIL) 875 MG tablet Take 875 mg by mouth 2 (two) times daily.   cloNIDine (CATAPRES) 0.1 MG tablet Take 1 tablet (0.1 mg total) by mouth 3 (three) times daily. Take as needed for flash backs, night terrors, blood pressure over 180   FLUoxetine (PROZAC) 20 MG capsule Take 1 capsule (20 mg total) by mouth daily.     Allergies:   Patient has no known allergies.   Social History   Socioeconomic History   Marital status: Single    Spouse name: Not on file   Number of children: Not on file   Years of education: Not on file   Highest education level: Not on file  Occupational History   Not on file  Tobacco Use   Smoking status: Never   Smokeless tobacco: Never  Vaping Use   Vaping Use: Never used  Substance and Sexual Activity   Alcohol use: No   Drug use: No   Sexual activity: Yes  Other Topics Concern   Not on file  Social History Narrative   Not on file   Social Determinants of Health   Financial Resource Strain: Not on file  Food Insecurity: Not on file  Transportation Needs: Not on file  Physical Activity: Not on file  Stress: Not on file  Social Connections: Not on file     Family History: The patient's family history includes ADD / ADHD in his mother; Asthma in his father and mother.  ROS:   Please see the history of present illness.     All other systems reviewed and  are negative.  EKGs/Labs/Other Studies Reviewed:    The following studies were reviewed today:   EKG:  EKG is  ordered today.  The ekg ordered today demonstrates   06/17/2022- NSR  Recent Labs: 01/19/2022: Magnesium 1.8 06/13/2022: ALT 33; B Natriuretic Peptide 7.0; BUN 15; Creatinine, Ser 1.31; Hemoglobin 15.1; Platelets 230; Potassium 3.6; Sodium 135  Recent Lipid Panel    Component Value Date/Time   CHOL 160 05/31/2022 1127   TRIG 67.0 05/31/2022 1127   HDL 42.00 05/31/2022 1127   CHOLHDL 4 05/31/2022 1127   VLDL 13.4 05/31/2022 1127   LDLCALC 104 (H) 05/31/2022 1127     Risk Assessment/Calculations:                Physical Exam:    VS:  BP 112/68 (BP Location: Left Arm, Patient Position: Sitting, Cuff Size: Normal)   Pulse 65   Ht 6\' 3"  (1.905 m)   Wt 213 lb 9.6 oz (96.9 kg)   SpO2 96%   BMI 26.70 kg/m     Wt Readings from Last 3 Encounters:  06/17/22 213 lb 9.6 oz (96.9 kg)  06/13/22 211 lb 10.3 oz (96 kg)  05/27/22 211 lb 9.6 oz (96 kg)     GEN:  Well nourished, well developed in no acute distress HEENT: Normal NECK: No JVD CARDIAC: RRR, no murmurs, rubs, gallops RESPIRATORY:  Clear to auscultation without rales, wheezing or rhonchi  ABDOMEN: Soft, non-tender, non-distended MUSCULOSKELETAL:  No edema; No deformity  SKIN: Warm and dry NEUROLOGIC:  Alert and oriented x 3 PSYCHIATRIC:  Normal affect   ASSESSMENT:    Syncope: no signs of HOCM. He did not have a prodrome which is typical of vasovagal syncope. His significant other is a former Engineer, civil (consulting) and she is not too concerned for a cardiac issue and thinks this may be anxiety related. Will be comprehensive and get a ziopatch and TTE.  To ensure no structural heart dx or correlated arrhythmia PLAN:    In order of problems listed above:  TTE 2 week ziopatch Follow up pending results     Medication Adjustments/Labs and Tests Ordered: Current medicines are reviewed at length with the patient today.   Concerns regarding medicines are outlined above.  Orders Placed This Encounter  Procedures   EKG 12-Lead   No orders of the defined types were placed in this encounter.   Patient Instructions  Medication Instructions:  No Changes In Medications at this time.   *If you need a refill on your cardiac medications before your next appointment, please call your pharmacy*  Lab Work: None Ordered At This Time.   If you have labs (blood work) drawn today and your tests are completely normal, you  will receive your results only by: MyChart Message (if you have MyChart) OR A paper copy in the mail If you have any lab test that is abnormal or we need to change your treatment, we will call you to review the results.  Testing/Procedures: Your physician has requested that you have an echocardiogram. Echocardiography is a painless test that uses sound waves to create images of your heart. It provides your doctor with information about the size and shape of your heart and how well your heart's chambers and valves are working. You may receive an ultrasound enhancing agent through an IV if needed to better visualize your heart during the echo.This procedure takes approximately one hour. There are no restrictions for this procedure. This will take place at the [address].     ZIO XT- Long Term Monitor Instructions   Your physician has requested you wear your ZIO patch monitor___14____days.   This is a single patch monitor.  [employer] supplies one patch monitor per enrollment.  Additional stickers are not available.   Please do not apply patch if you will be having a Nuclear Stress Test, Echocardiogram, Cardiac CT, MRI, or Chest Xray during the time frame you would be wearing the monitor. The patch cannot be worn during these tests.  You cannot remove and re-apply the ZIO XT patch monitor.   Your ZIO patch monitor will be sent USPS Priority mail from [facility] directly to your  home address. The monitor may also be mailed to a PO BOX if home delivery is not available.   It may take 3-5 days to receive your monitor after you have been enrolled.   Once you have received you monitor, please review enclosed instructions.  Your monitor has already been registered assigning a specific monitor serial # to you.   Applying the monitor   Shave hair from upper left chest.   Hold abrader disc by orange tab.  Rub abrader in 40 strokes over left upper chest as indicated in your monitor instructions.   Clean area with 4 enclosed alcohol pads .  Use all pads to assure are is cleaned thoroughly.  Let dry.   Apply patch as indicated in monitor instructions.  Patch will be place under collarbone on left side of chest with arrow pointing upward.   Rub patch adhesive wings for 2 minutes.Remove white label marked "1".  Remove white label marked "2".  Rub patch adhesive wings for 2 additional minutes.   While looking in a mirror, press and release button in center of patch.  A small green light will flash 3-4 times .  This will be your only indicator the monitor has been turned on.     Do not shower for the first 24 hours.  You may shower after the first 24 hours.   Press button if you feel a symptom. You will hear a small click.  Record Date, Time and Symptom in the Patient Log Book.   When you are ready to remove patch, follow instructions on last 2 pages of Patient Log Book.  Stick patch monitor onto last page of Patient Log Book.   Place Patient Log Book in [place] box.  Use locking tab on box and tape box closed securely.  The [employer] has [employer] on it.  Please place in mailbox as soon as possible.  Your physician should have your test results approximately 7 days after the monitor has been mailed back to  [employer].   Call [facility] Customer Care at [phone] if you have questions regarding your ZIO XT patch monitor.  Call them immediately if you see  an orange light blinking on your monitor.   If your monitor falls off in less than 4 days contact our Monitor department at [phone].  If your monitor becomes loose or falls off after 4 days call [employer] at [phone] for suggestions on securing your monitor.    Follow-Up: At [facility], you and your health needs are our priority.  As part of our continuing mission to provide you with exceptional heart care, we have created designated Provider Care Teams.  These Care Teams include your primary Cardiologist (physician) and Advanced Practice Providers (APPs -  Physician Assistants and Nurse Practitioners) who all work together to provide you with the care you need, when you need it.  Your next appointment:   AS NEEDED PENDING RESULTS   Provider:   Maisie Fus, MD     Signed, Maisie Fus, MD  06/17/2022 3:13 PM    [facility]

## 2022-06-17 NOTE — Patient Instructions (Signed)
Medication Instructions:  No Changes In Medications at this time.   *If you need a refill on your cardiac medications before your next appointment, please call your pharmacy*  Lab Work: None Ordered At This Time.   If you have labs (blood work) drawn today and your tests are completely normal, you will receive your results only by: MyChart Message (if you have MyChart) OR A paper copy in the mail If you have any lab test that is abnormal or we need to change your treatment, we will call you to review the results.  Testing/Procedures: Your physician has requested that you have an echocardiogram. Echocardiography is a painless test that uses sound waves to create images of your heart. It provides your doctor with information about the size and shape of your heart and how well your heart's chambers and valves are working. You may receive an ultrasound enhancing agent through an IV if needed to better visualize your heart during the echo.This procedure takes approximately one hour. There are no restrictions for this procedure. This will take place at the [address], [address].     ZIO XT- Long Term Monitor Instructions   Your physician has requested you wear your ZIO patch monitor___14____days.   This is a single patch monitor.  [employer] supplies one patch monitor per enrollment.  Additional stickers are not available.   Please do not apply patch if you will be having a Nuclear Stress Test, Echocardiogram, Cardiac CT, MRI, or Chest Xray during the time frame you would be wearing the monitor. The patch cannot be worn during these tests.  You cannot remove and re-apply the ZIO XT patch monitor.   Your ZIO patch monitor will be sent USPS Priority mail from [facility] directly to your home address. The monitor may also be mailed to a PO BOX if home delivery is not available.   It may take 3-5 days to receive your monitor after you have been enrolled.   Once you have received you  monitor, please review enclosed instructions.  Your monitor has already been registered assigning a specific monitor serial # to you.   Applying the monitor   Shave hair from upper left chest.   Hold abrader disc by orange tab.  Rub abrader in 40 strokes over left upper chest as indicated in your monitor instructions.   Clean area with 4 enclosed alcohol pads .  Use all pads to assure are is cleaned thoroughly.  Let dry.   Apply patch as indicated in monitor instructions.  Patch will be place under collarbone on left side of chest with arrow pointing upward.   Rub patch adhesive wings for 2 minutes.Remove white label marked "1".  Remove white label marked "2".  Rub patch adhesive wings for 2 additional minutes.   While looking in a mirror, press and release button in center of patch.  A small green light will flash 3-4 times .  This will be your only indicator the monitor has been turned on.     Do not shower for the first 24 hours.  You may shower after the first 24 hours.   Press button if you feel a symptom. You will hear a small click.  Record Date, Time and Symptom in the Patient Log Book.   When you are ready to remove patch, follow instructions on last 2 pages of Patient Log Book.  Stick patch monitor onto last page of Patient Log Book.   Place Patient Log  Book in [facility] box.  Use locking tab on box and tape box closed securely.  The [employer] has [employer] on it.  Please place in mailbox as soon as possible.  Your physician should have your test results approximately 7 days after the monitor has been mailed back to [facility].   Call [facility] Customer Care at [phone] if you have questions regarding your ZIO XT patch monitor.  Call them immediately if you see an orange light blinking on your monitor.   If your monitor falls off in less than 4 days contact our Monitor department at [phone].  If your monitor becomes loose or falls off after 4 days  call [employer] at [phone] for suggestions on securing your monitor.    Follow-Up: At [facility], you and your health needs are our priority.  As part of our continuing mission to provide you with exceptional heart care, we have created designated Provider Care Teams.  These Care Teams include your primary Cardiologist (physician) and Advanced Practice Providers (APPs -  Physician Assistants and Nurse Practitioners) who all work together to provide you with the care you need, when you need it.  Your next appointment:   AS NEEDED PENDING RESULTS   Provider:   Maisie Fus, MD

## 2022-06-21 DIAGNOSIS — R55 Syncope and collapse: Secondary | ICD-10-CM

## 2022-08-07 ENCOUNTER — Ambulatory Visit (HOSPITAL_COMMUNITY): Payer: 59 | Attending: Internal Medicine

## 2022-08-07 DIAGNOSIS — I088 Other rheumatic multiple valve diseases: Secondary | ICD-10-CM

## 2022-08-07 DIAGNOSIS — R55 Syncope and collapse: Secondary | ICD-10-CM | POA: Diagnosis present

## 2022-08-07 DIAGNOSIS — I517 Cardiomegaly: Secondary | ICD-10-CM

## 2022-08-07 LAB — ECHOCARDIOGRAM COMPLETE
Area-P 1/2: 4.29 cm2
S' Lateral: 3.2 cm

## 2022-10-26 ENCOUNTER — Emergency Department (HOSPITAL_COMMUNITY): Payer: 59

## 2022-10-26 ENCOUNTER — Encounter (HOSPITAL_COMMUNITY): Payer: Self-pay | Admitting: Emergency Medicine

## 2022-10-26 ENCOUNTER — Other Ambulatory Visit: Payer: Self-pay

## 2022-10-26 ENCOUNTER — Emergency Department (HOSPITAL_COMMUNITY)
Admission: EM | Admit: 2022-10-26 | Discharge: 2022-10-26 | Disposition: A | Discharge status: Home / Self Care | Payer: 59 | Attending: Emergency Medicine | Admitting: Emergency Medicine

## 2022-10-26 DIAGNOSIS — R55 Syncope and collapse: Secondary | ICD-10-CM | POA: Diagnosis present

## 2022-10-26 DIAGNOSIS — M546 Pain in thoracic spine: Secondary | ICD-10-CM | POA: Diagnosis not present

## 2022-10-26 DIAGNOSIS — M545 Low back pain, unspecified: Secondary | ICD-10-CM | POA: Insufficient documentation

## 2022-10-26 LAB — CBC
HCT: 44.7 % (ref 39.0–52.0)
Hemoglobin: 15.2 g/dL (ref 13.0–17.0)
MCH: 29.9 pg (ref 26.0–34.0)
MCHC: 34 g/dL (ref 30.0–36.0)
MCV: 88 fL (ref 80.0–100.0)
Platelets: 238 10*3/uL (ref 150–400)
RBC: 5.08 MIL/uL (ref 4.22–5.81)
RDW: 12.1 % (ref 11.5–15.5)
WBC: 6 10*3/uL (ref 4.0–10.5)
nRBC: 0 % (ref 0.0–0.2)

## 2022-10-26 LAB — URINALYSIS, ROUTINE W REFLEX MICROSCOPIC
Bilirubin Urine: NEGATIVE
Glucose, UA: NEGATIVE mg/dL
Hgb urine dipstick: NEGATIVE
Ketones, ur: NEGATIVE mg/dL
Leukocytes,Ua: NEGATIVE
Nitrite: NEGATIVE
Protein, ur: NEGATIVE mg/dL
Specific Gravity, Urine: 1.02 (ref 1.005–1.030)
pH: 8.5 — ABNORMAL HIGH (ref 5.0–8.0)

## 2022-10-26 LAB — BASIC METABOLIC PANEL
Anion gap: 14 (ref 5–15)
BUN: 9 mg/dL (ref 6–20)
CO2: 25 mmol/L (ref 22–32)
Calcium: 9.7 mg/dL (ref 8.9–10.3)
Chloride: 100 mmol/L (ref 98–111)
Creatinine, Ser: 1.52 mg/dL — ABNORMAL HIGH (ref 0.61–1.24)
GFR, Estimated: >60 mL/min (ref >60)
Glucose, Bld: 123 mg/dL — ABNORMAL HIGH (ref 70–99)
Potassium: 3.6 mmol/L (ref 3.5–5.1)
Sodium: 139 mmol/L (ref 135–145)

## 2022-10-26 LAB — MAGNESIUM: Magnesium: 1.6 mg/dL — ABNORMAL LOW (ref 1.7–2.4)

## 2022-10-26 MED ORDER — MAGNESIUM OXIDE -MG SUPPLEMENT 400 (240 MG) MG PO TABS
400.0000 mg | ORAL_TABLET | Freq: Once | ORAL | Status: AC
  Administered 2022-10-26: 400 mg via ORAL
  Filled 2022-10-26: qty 1

## 2022-10-26 MED ORDER — LIDOCAINE 5 % EX PTCH
1.0000 | MEDICATED_PATCH | CUTANEOUS | 0 refills | Status: AC
  Filled 2022-10-26: qty 30, 30d supply, fill #0

## 2022-10-26 MED ORDER — MAGNESIUM 30 MG PO TABS
30.0000 mg | ORAL_TABLET | Freq: Every day | ORAL | 0 refills | Status: AC
Start: 2022-10-26 — End: 2022-11-09
  Filled 2022-10-26: qty 14, 14d supply, fill #0

## 2022-10-26 MED ORDER — KETOROLAC TROMETHAMINE 15 MG/ML IJ SOLN
15.0000 mg | Freq: Once | INTRAMUSCULAR | Status: AC
  Administered 2022-10-26: 15 mg via INTRAMUSCULAR
  Filled 2022-10-26: qty 1

## 2022-10-26 MED ORDER — IBUPROFEN 600 MG PO TABS
600.0000 mg | ORAL_TABLET | Freq: Four times a day (QID) | ORAL | 0 refills | Status: AC | PRN
  Filled 2022-10-26: qty 30, 8d supply, fill #0

## 2022-10-26 MED ORDER — ACETAMINOPHEN 500 MG PO TABS
500.0000 mg | ORAL_TABLET | Freq: Four times a day (QID) | ORAL | 0 refills | Status: AC | PRN
  Filled 2022-10-26: qty 30, 8d supply, fill #0
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
## 2022-10-28 ENCOUNTER — Other Ambulatory Visit: Payer: Self-pay

## 2022-10-28 ENCOUNTER — Other Ambulatory Visit (HOSPITAL_COMMUNITY): Payer: Self-pay

## 2022-11-07 ENCOUNTER — Other Ambulatory Visit (HOSPITAL_COMMUNITY): Payer: Self-pay

## 2023-02-28 ENCOUNTER — Ambulatory Visit
Admission: RE | Admit: 2023-02-28 | Discharge: 2023-02-28 | Disposition: A | Discharge status: Home / Self Care | Payer: 59 | Source: Ambulatory Visit | Attending: Internal Medicine

## 2023-02-28 VITALS — BP 117/86 | HR 90 | Temp 98.0°F | Resp 16

## 2023-02-28 DIAGNOSIS — J209 Acute bronchitis, unspecified: Secondary | ICD-10-CM

## 2023-02-28 LAB — POC COVID19/FLU A&B COMBO
Covid Antigen, POC: NEGATIVE
Influenza A Antigen, POC: NEGATIVE
Influenza B Antigen, POC: NEGATIVE

## 2023-02-28 MED ORDER — ONDANSETRON 4 MG PO TBDP
4.0000 mg | ORAL_TABLET | Freq: Once | ORAL | Status: AC
  Administered 2023-02-28: 4 mg via ORAL

## 2023-02-28 MED ORDER — ONDANSETRON 4 MG PO TBDP: 4.0000 mg | ORAL_TABLET | Freq: Three times a day (TID) | ORAL | 0 refills | Status: AC | PRN

## 2023-02-28 MED ORDER — PREDNISONE 20 MG PO TABS: 40.0000 mg | ORAL_TABLET | Freq: Every day | ORAL | 0 refills | Status: AC

## 2023-02-28 MED ORDER — ALBUTEROL SULFATE HFA 108 (90 BASE) MCG/ACT IN AERS
1.0000 | INHALATION_SPRAY | Freq: Four times a day (QID) | RESPIRATORY_TRACT | 0 refills | Status: AC | PRN
Start: 2023-02-28 — End: ?

## 2023-02-28 MED ORDER — PROMETHAZINE-DM 6.25-15 MG/5ML PO SYRP: 5.0000 mL | ORAL_SOLUTION | Freq: Every evening | ORAL | 0 refills | Status: AC | PRN

## 2023-02-28 NOTE — Discharge Instructions (Signed)
You have bronchitis which is inflammation of the upper airways in your lungs due to a virus. The following medicines will help with your symptoms.   - Take steroid pills sent to pharmacy as directed. Do not take any other NSAID containing medications such as ibuprofen or naproxen/Aleve while taking prednisone. - You may use albuterol inhaler 1 to 2 puffs every 4-6 hours as needed for cough, shortness of breath, and wheezing. - Take cough medicines as needed. - Use mucinex to break up congestion in nose/chest (guaifenesin 600mg  every 12 hours as needed).   If you develop any new or worsening symptoms or do not improve in the next 2 to 3 days, please return.  If your symptoms are severe, please go to the emergency room. Follow-up with PCP as needed.
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
## 2023-02-28 NOTE — ED Triage Notes (Signed)
Pt presents with cough, sob, low grade fever since yesterday.  Took tylenol around 5am today and mucinex at 10am

## 2023-07-10 ENCOUNTER — Emergency Department (HOSPITAL_COMMUNITY)

## 2023-07-10 ENCOUNTER — Emergency Department (HOSPITAL_COMMUNITY)
Admission: EM | Admit: 2023-07-10 | Discharge: 2023-07-10 | Disposition: A | Discharge status: Home / Self Care | Attending: Emergency Medicine | Admitting: Emergency Medicine

## 2023-07-10 ENCOUNTER — Encounter (HOSPITAL_COMMUNITY): Payer: Self-pay | Admitting: Emergency Medicine

## 2023-07-10 ENCOUNTER — Other Ambulatory Visit: Payer: Self-pay

## 2023-07-10 DIAGNOSIS — R55 Syncope and collapse: Secondary | ICD-10-CM | POA: Diagnosis present

## 2023-07-10 DIAGNOSIS — R4020 Unspecified coma: Secondary | ICD-10-CM

## 2023-07-10 LAB — CBC WITH DIFFERENTIAL/PLATELET
Abs Immature Granulocytes: 0.02 10*3/uL (ref 0.00–0.07)
Basophils Absolute: 0 10*3/uL (ref 0.0–0.1)
Basophils Relative: 0 %
Eosinophils Absolute: 0.1 10*3/uL (ref 0.0–0.5)
Eosinophils Relative: 1 %
HCT: 42.6 % (ref 39.0–52.0)
Hemoglobin: 15 g/dL (ref 13.0–17.0)
Immature Granulocytes: 0 %
Lymphocytes Relative: 23 %
Lymphs Abs: 1.9 10*3/uL (ref 0.7–4.0)
MCH: 30.7 pg (ref 26.0–34.0)
MCHC: 35.2 g/dL (ref 30.0–36.0)
MCV: 87.1 fL (ref 80.0–100.0)
Monocytes Absolute: 0.7 10*3/uL (ref 0.1–1.0)
Monocytes Relative: 8 %
Neutro Abs: 5.7 10*3/uL (ref 1.7–7.7)
Neutrophils Relative %: 68 %
Platelets: 258 10*3/uL (ref 150–400)
RBC: 4.89 MIL/uL (ref 4.22–5.81)
RDW: 11.8 % (ref 11.5–15.5)
WBC: 8.4 10*3/uL (ref 4.0–10.5)
nRBC: 0 % (ref 0.0–0.2)

## 2023-07-10 LAB — BASIC METABOLIC PANEL WITH GFR
Anion gap: 12 (ref 5–15)
BUN: 7 mg/dL (ref 6–20)
CO2: 24 mmol/L (ref 22–32)
Calcium: 9.5 mg/dL (ref 8.9–10.3)
Chloride: 101 mmol/L (ref 98–111)
Creatinine, Ser: 1.26 mg/dL — ABNORMAL HIGH (ref 0.61–1.24)
GFR, Estimated: >60 mL/min (ref >60)
Glucose, Bld: 90 mg/dL (ref 70–99)
Potassium: 3.3 mmol/L — ABNORMAL LOW (ref 3.5–5.1)
Sodium: 137 mmol/L (ref 135–145)

## 2023-07-10 MED ORDER — ACETAMINOPHEN 500 MG PO TABS
1000.0000 mg | ORAL_TABLET | Freq: Once | ORAL | Status: AC
  Administered 2023-07-10: 1000 mg via ORAL
  Filled 2023-07-10: qty 2
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
## 2023-07-10 NOTE — Discharge Instructions (Signed)
 You have been seen and discharged from the emergency department.  Your blood work was normal.  The CT scan of your head and neck were unremarkable.  The EKG of your heart and your chest x-ray showed no abnormal finding.  It appears that you could have passed out versus possible first-time seizure.  Call to establish outpatient follow-up with neurology for further evaluation and mostly outpatient EEG.  The following are seizure precautions that you need to follow.  Do not cook alone.  Take showers and do not take a bath alone for the risk of drowning if you are to have a seizure.  Do not swim alone.  Do not drive until you are cleared by neurology. Do not climb high structures or operate heavy machinery until you are cleared by neurology.  Follow-up with your primary provider for further evaluation and further care. Take home medications as prescribed. If you have any worsening symptoms or further concerns for your health please return to an emergency department for further evaluation.
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
## 2023-07-10 NOTE — ED Provider Notes (Signed)
 [place] EMERGENCY DEPARTMENT AT [facility] Provider Note   CSN: [id number] Arrival date & time: 07/10/23  1718     History  No chief complaint on file.   Todd Holloway is a 28 y.o. male.  HPI   28 year old male presents to the emergency department after loss of consciousness.  Patient was reportedly sitting in a cell when he became unconscious, and fell to the ground.  Unclear if the patient hit his head.  There was reported some shaking activity but no tongue biting or incontinence.  Unclear how long the patient was unconscious for.  Patient denies any history of seizures.  He admits that he does sometimes have syncope secondary to severe anxiety.  He admits to being very anxious with his current [facility].  Earlier in the week he had some mild nausea but otherwise denies any acute changes in his health including fever, headache/neck pain/stiffness, vomiting, diarrhea.  Home Medications Prior to Admission medications   Medication Sig Start Date End Date Taking? Authorizing Provider  acetaminophen  (TYLENOL ) 500 MG tablet Take 1 tablet (500 mg total) by mouth every 6 (six) hours as needed. 10/26/22   Dirk Fredericks, DO  albuterol  (VENTOLIN  HFA) 108 (90 Base) MCG/ACT inhaler Inhale 1-2 puffs into the lungs every 6 (six) hours as needed for wheezing or shortness of breath. 02/28/23   Starlene Eaton, FNP  amoxicillin (AMOXIL) 875 MG tablet Take 875 mg by mouth 2 (two) times daily. Patient not taking: Reported on 02/28/2023 06/10/22   [provider]  cetirizine (ZYRTEC) 10 MG tablet Take by mouth. Patient not taking: Reported on 06/17/2022 01/22/15   [provider]  cloNIDine  (CATAPRES ) 0.1 MG tablet Take 1 tablet (0.1 mg total) by mouth 3 (three) times daily. Take as needed for flash backs, night terrors, blood pressure over 180 Patient not taking: Reported on 02/28/2023 05/27/22   Anthon Kins, MD  FLUoxetine  (PROZAC ) 20 MG  capsule Take 1 capsule (20 mg total) by mouth daily. 05/27/22   Anthon Kins, MD  ibuprofen  (ADVIL ) 600 MG tablet Take 1 tablet (600 mg total) by mouth every 6 (six) hours as needed. 10/26/22   Dirk Fredericks, DO  lidocaine  (LIDODERM ) 5 % Place 1 patch onto the skin daily. Remove & Discard patch within 12 hours or as directed by MD 10/26/22   Dirk Fredericks, DO  ondansetron  (ZOFRAN -ODT) 4 MG disintegrating tablet Take 1 tablet (4 mg total) by mouth every 8 (eight) hours as needed for nausea or vomiting. 02/28/23   Starlene Eaton, FNP  promethazine -dextromethorphan (PROMETHAZINE -DM) 6.25-15 MG/5ML syrup Take 5 mLs by mouth at bedtime as needed for cough. 02/28/23   Starlene Eaton, FNP      Allergies    Patient has no known allergies.    Review of Systems   Review of Systems  Constitutional:  Positive for fatigue. Negative for fever.  Respiratory:  Negative for shortness of breath.   Cardiovascular:  Negative for chest pain.  Gastrointestinal:  Positive for nausea. Negative for abdominal pain, diarrhea and vomiting.  Musculoskeletal:  Negative for neck pain and neck stiffness.  Skin:  Negative for rash.  Neurological:  Negative for headaches.       + LOC    Physical Exam Updated Vital Signs BP (!) 133/103 (BP Location: Right Arm)   Pulse 79   Temp 98.4 F (36.9 C) (Oral)   Resp 19   Ht 6\' 3"  (1.905 m)  Wt 104.3 kg   SpO2 98%   BMI 28.75 kg/m  Physical Exam Vitals and nursing note reviewed.  Constitutional:      General: He is not in acute distress.    Appearance: Normal appearance.  HENT:     Head: Normocephalic.     Mouth/Throat:     Mouth: Mucous membranes are moist.     Comments: Poor dentition, multiple cracked teeth,  old, no intra oral injury, tongue unremarkable Eyes:     Extraocular Movements: Extraocular movements intact.     Pupils: Pupils are equal, round, and reactive to light.  Neck:     Comments: C collar in place Cardiovascular:      Rate and Rhythm: Normal rate.  Pulmonary:     Effort: Pulmonary effort is normal. No respiratory distress.  Abdominal:     Palpations: Abdomen is soft.     Tenderness: There is no abdominal tenderness.  Musculoskeletal:        General: No swelling or deformity.     Cervical back: No tenderness.  Skin:    General: Skin is warm.  Neurological:     General: No focal deficit present.     Mental Status: He is alert and oriented to person, place, and time. Mental status is at baseline.     Cranial Nerves: No cranial nerve deficit.  Psychiatric:        Mood and Affect: Mood normal.     ED Results / Procedures / Treatments   Labs (all labs ordered are listed, but only abnormal results are displayed) Labs Reviewed  CBC WITH DIFFERENTIAL/PLATELET  BASIC METABOLIC PANEL WITH GFR    EKG None  Radiology No results found.  Procedures Procedures    Medications Ordered in ED Medications - No data to display  ED Course/ Medical Decision Making/ A&P                                 Medical Decision Making Amount and/or Complexity of Data Reviewed Labs: ordered. Radiology: ordered.  Risk OTC drugs.   28 year old male presents emergency department from jail after an episode of loss of consciousness.  There was possibly some shaking during this episode but no tongue biting or incontinence.  No history of epilepsy.  Patient admits to episodes of syncope related to severe anxiety.  He did experience anxiousness in regards to his current status of being in jail.  Arrived in a c-collar.  No obvious head trauma, intraoral trauma or other injuries.  EKG is normal sinus rhythm with normal intervals.  Blood work is unremarkable.  CT of the head and cervical spine show no acute finding.  C-collar cleared at bedside.  Chest x-ray is unremarkable.  On reevaluation patient had developed some mild sternal chest pain, somewhat reproducible.  Did reportedly have sternal rub done when he had lost  consciousness.  Repeat EKG is unchanged.  After dose of Tylenol  and letting him eat and drink he feels back to baseline.  He offers no new concerns or complaints.  Vitals are normal and stable.  Discussed seizure precautions, recommended outpatient neurology follow-up or possible outpatient EEG.  Patient at this time appears safe and stable for discharge and close outpatient follow up. Discharge plan and strict return to ED precautions discussed, patient verbalizes understanding and agreement.        Final Clinical Impression(s) / ED Diagnoses Final diagnoses:  None  Rx / DC Orders ED Discharge Orders     None         Flonnie Humphrey, DO 07/10/23 2232
